# Patient Record
Sex: Male | Born: 1965
Health system: Southern US, Community
[De-identification: ages and names within clinical notes are randomized; demographics above are authoritative.]

## PROBLEM LIST (undated history)

## (undated) DIAGNOSIS — I10 Essential (primary) hypertension: Secondary | ICD-10-CM

## (undated) DIAGNOSIS — I6529 Occlusion and stenosis of unspecified carotid artery: Secondary | ICD-10-CM

## (undated) DIAGNOSIS — G459 Transient cerebral ischemic attack, unspecified: Secondary | ICD-10-CM

## (undated) DIAGNOSIS — J40 Bronchitis, not specified as acute or chronic: Secondary | ICD-10-CM

## (undated) DIAGNOSIS — D649 Anemia, unspecified: Secondary | ICD-10-CM

## (undated) DIAGNOSIS — I2510 Atherosclerotic heart disease of native coronary artery without angina pectoris: Secondary | ICD-10-CM

## (undated) DIAGNOSIS — M1990 Unspecified osteoarthritis, unspecified site: Secondary | ICD-10-CM

## (undated) DIAGNOSIS — I739 Peripheral vascular disease, unspecified: Secondary | ICD-10-CM

## (undated) DIAGNOSIS — K219 Gastro-esophageal reflux disease without esophagitis: Secondary | ICD-10-CM

## (undated) DIAGNOSIS — I219 Acute myocardial infarction, unspecified: Secondary | ICD-10-CM

## (undated) HISTORY — DX: Transient cerebral ischemic attack, unspecified: G45.9

## (undated) HISTORY — DX: Hyperlipidemia, unspecified: E78.5

## (undated) HISTORY — DX: Occlusion and stenosis of unspecified carotid artery: I65.29

## (undated) HISTORY — PX: BACK SURGERY: SHX140

---

## 2010-03-23 ENCOUNTER — Ambulatory Visit: Payer: Self-pay | Admitting: Cardiology

## 2011-06-14 ENCOUNTER — Emergency Department (HOSPITAL_COMMUNITY)
Admission: EM | Admit: 2011-06-14 | Discharge: 2011-06-14 | Disposition: A | Discharge status: Home / Self Care | Payer: Worker's Compensation | Attending: Emergency Medicine | Admitting: Emergency Medicine

## 2011-06-14 ENCOUNTER — Encounter: Payer: Self-pay | Admitting: Emergency Medicine

## 2011-06-14 ENCOUNTER — Emergency Department (HOSPITAL_COMMUNITY): Payer: Worker's Compensation

## 2011-06-14 DIAGNOSIS — R51 Headache: Secondary | ICD-10-CM | POA: Insufficient documentation

## 2011-06-14 DIAGNOSIS — Z79899 Other long term (current) drug therapy: Secondary | ICD-10-CM | POA: Insufficient documentation

## 2011-06-14 DIAGNOSIS — I10 Essential (primary) hypertension: Secondary | ICD-10-CM | POA: Insufficient documentation

## 2011-06-14 HISTORY — DX: Essential (primary) hypertension: I10

## 2011-06-14 MED ORDER — OXYCODONE-ACETAMINOPHEN 5-325 MG PO TABS: 2.0000 | ORAL_TABLET | ORAL | Status: DC | PRN

## 2011-06-14 MED ORDER — OXYCODONE-ACETAMINOPHEN 5-325 MG PO TABS
1.0000 | ORAL_TABLET | Freq: Once | ORAL | Status: AC
  Administered 2011-06-14: 1 via ORAL
  Filled 2011-06-14: qty 1
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
## 2011-06-16 ENCOUNTER — Encounter (HOSPITAL_COMMUNITY): Payer: Self-pay | Admitting: *Deleted

## 2011-06-16 ENCOUNTER — Emergency Department (HOSPITAL_COMMUNITY)
Admission: EM | Admit: 2011-06-16 | Discharge: 2011-06-16 | Disposition: A | Discharge status: Home / Self Care | Payer: Worker's Compensation | Attending: Emergency Medicine | Admitting: Emergency Medicine

## 2011-06-16 DIAGNOSIS — S139XXA Sprain of joints and ligaments of unspecified parts of neck, initial encounter: Secondary | ICD-10-CM | POA: Insufficient documentation

## 2011-06-16 DIAGNOSIS — I10 Essential (primary) hypertension: Secondary | ICD-10-CM | POA: Insufficient documentation

## 2011-06-16 DIAGNOSIS — F17200 Nicotine dependence, unspecified, uncomplicated: Secondary | ICD-10-CM | POA: Insufficient documentation

## 2011-06-16 DIAGNOSIS — G44209 Tension-type headache, unspecified, not intractable: Secondary | ICD-10-CM

## 2011-06-16 DIAGNOSIS — IMO0002 Reserved for concepts with insufficient information to code with codable children: Secondary | ICD-10-CM

## 2011-06-16 MED ORDER — DEXAMETHASONE SODIUM PHOSPHATE 10 MG/ML IJ SOLN
10.0000 mg | Freq: Once | INTRAMUSCULAR | Status: AC
  Administered 2011-06-16: 10 mg via INTRAMUSCULAR
  Filled 2011-06-16: qty 1

## 2011-06-16 MED ORDER — PREDNISONE 20 MG PO TABS: 60.0000 mg | ORAL_TABLET | Freq: Every day | ORAL | Status: AC

## 2011-06-16 MED ORDER — DIAZEPAM 5 MG PO TABS: 5.0000 mg | ORAL_TABLET | Freq: Three times a day (TID) | ORAL | Status: AC | PRN

## 2011-06-16 MED ORDER — DIAZEPAM 5 MG PO TABS
5.0000 mg | ORAL_TABLET | Freq: Once | ORAL | Status: AC
  Administered 2011-06-16: 5 mg via ORAL
  Filled 2011-06-16: qty 1

## 2011-06-16 MED ORDER — DEXAMETHASONE SODIUM PHOSPHATE 10 MG/ML IJ SOLN: 10.0000 mg | Freq: Once | INTRAMUSCULAR | Status: DC
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
## 2011-06-28 NOTE — ED Provider Notes (Signed)
Medical screening examination/treatment/procedure(s) were performed by non-physician practitioner and as supervising physician I was immediately available for consultation/collaboration.  Nicholes Stairs, MD 06/28/11 [phone]

## 2013-01-24 ENCOUNTER — Other Ambulatory Visit: Payer: Self-pay | Admitting: Neurosurgery

## 2013-01-29 ENCOUNTER — Ambulatory Visit (HOSPITAL_COMMUNITY)
Admission: RE | Admit: 2013-01-29 | Discharge: 2013-01-29 | Disposition: A | Discharge status: Home / Self Care | Payer: Worker's Compensation | Source: Ambulatory Visit | Attending: Anesthesiology | Admitting: Anesthesiology

## 2013-01-29 ENCOUNTER — Encounter (HOSPITAL_COMMUNITY): Payer: Self-pay

## 2013-01-29 ENCOUNTER — Encounter (HOSPITAL_COMMUNITY): Payer: Self-pay | Admitting: Pharmacy Technician

## 2013-01-29 ENCOUNTER — Encounter (HOSPITAL_COMMUNITY)
Admission: RE | Admit: 2013-01-29 | Discharge: 2013-01-29 | Disposition: A | Discharge status: Home / Self Care | Payer: Worker's Compensation | Source: Ambulatory Visit | Attending: Neurosurgery | Admitting: Neurosurgery

## 2013-01-29 HISTORY — DX: Gastro-esophageal reflux disease without esophagitis: K21.9

## 2013-01-29 LAB — CBC
HCT: 42.8 % (ref 39.0–52.0)
MCH: 30.3 pg (ref 26.0–34.0)
MCHC: 34.6 g/dL (ref 30.0–36.0)
MCV: 87.7 fL (ref 78.0–100.0)
RDW: 13.1 % (ref 11.5–15.5)

## 2013-01-29 LAB — BASIC METABOLIC PANEL
BUN: 8 mg/dL (ref 6–23)
CO2: 24 mEq/L (ref 19–32)
Chloride: 102 mEq/L (ref 96–112)
Creatinine, Ser: 1.02 mg/dL (ref 0.50–1.35)
GFR calc Af Amer: >90 mL/min (ref >90)
Glucose, Bld: 93 mg/dL (ref 70–99)

## 2013-01-29 LAB — SURGICAL PCR SCREEN: MRSA, PCR: NEGATIVE

## 2013-01-29 NOTE — Pre-Procedure Instructions (Signed)
Dale Fowler  01/29/2013   Your procedure is scheduled on:  May 22  Report to [facility] at 1:00 PM.  Call this number if you have problems the morning of surgery: [phone]   Remember:   Do not eat food or drink liquids after midnight.   Take these medicines the morning of surgery with A SIP OF WATER: None   Do not wear jewelry, make-up or nail polish.  Do not wear lotions, powders, or perfumes. You may wear deodorant.  Do not shave 48 hours prior to surgery. Men may shave face and neck.  Do not bring valuables to the hospital.  Contacts, dentures or bridgework may not be worn into surgery.  Leave suitcase in the car. After surgery it may be brought to your room.  For patients admitted to the hospital, checkout time is 11:00 AM the day of discharge.   Special Instructions: Shower using CHG 2 nights before surgery and the night before surgery.  If you shower the day of surgery use CHG.  Use special wash - you have one bottle of CHG for all showers.  You should use approximately 1/3 of the bottle for each shower.   Please read over the following fact sheets that you were given: Pain Booklet, Coughing and Deep Breathing and Surgical Site Infection Prevention

## 2013-01-29 NOTE — Progress Notes (Signed)
Spoke with Erie Noe and requested that MD sign orders

## 2013-01-30 ENCOUNTER — Encounter (HOSPITAL_COMMUNITY): Payer: Self-pay | Admitting: Certified Registered"

## 2013-01-30 ENCOUNTER — Ambulatory Visit (HOSPITAL_COMMUNITY): Payer: Worker's Compensation | Admitting: Certified Registered"

## 2013-01-30 ENCOUNTER — Encounter (HOSPITAL_COMMUNITY)
Admission: RE | Disposition: A | Discharge status: Home / Self Care | Payer: Self-pay | Source: Ambulatory Visit | Attending: Neurosurgery

## 2013-01-30 ENCOUNTER — Ambulatory Visit (HOSPITAL_COMMUNITY): Payer: Worker's Compensation

## 2013-01-30 ENCOUNTER — Ambulatory Visit (HOSPITAL_COMMUNITY)
Admission: RE | Admit: 2013-01-30 | Discharge: 2013-01-31 | Disposition: A | Discharge status: Home / Self Care | Payer: Worker's Compensation | Source: Ambulatory Visit | Attending: Neurosurgery | Admitting: Neurosurgery

## 2013-01-30 DIAGNOSIS — I10 Essential (primary) hypertension: Secondary | ICD-10-CM | POA: Insufficient documentation

## 2013-01-30 DIAGNOSIS — M4716 Other spondylosis with myelopathy, lumbar region: Secondary | ICD-10-CM

## 2013-01-30 DIAGNOSIS — M47817 Spondylosis without myelopathy or radiculopathy, lumbosacral region: Secondary | ICD-10-CM | POA: Insufficient documentation

## 2013-01-30 HISTORY — PX: LUMBAR LAMINECTOMY/DECOMPRESSION MICRODISCECTOMY: SHX5026

## 2013-01-30 IMAGING — CR DG LUMBAR SPINE 2-3V
1 series · 1 of 1 positions shown · non-contrast
Comparison: Lumbar myelogram and CT myelogram [DATE] which
demonstrated five non-rib bearing lumbar vertebrae.

CLINICAL DATA: L4-5 microdiskectomy.

OPERATIVE LUMBAR SPINE - 2-3 VIEW

[view not recorded]
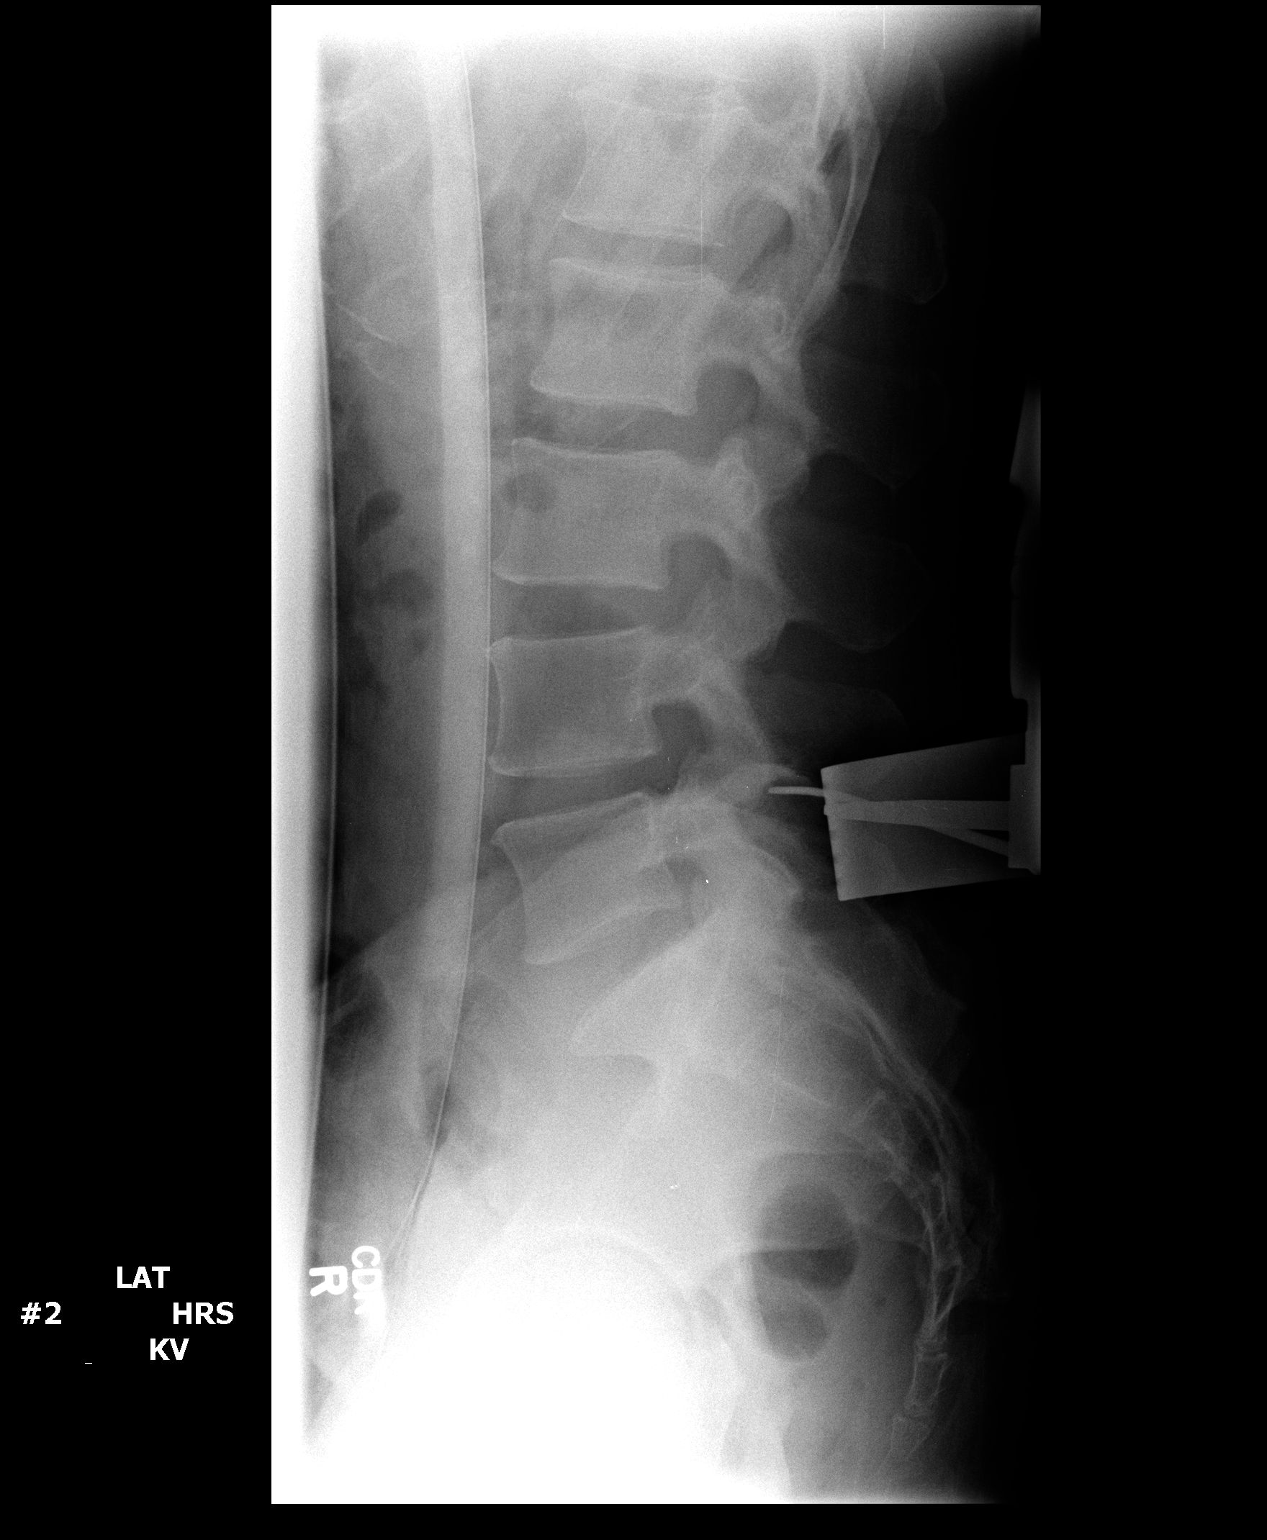

[1 of 1 positions shown; findings below may reference images not displayed]

FINDINGS: Images were submitted for interpretation postoperatively.
Image labeled #1 obtained at [QT] hours demonstrates the localizer
needle adjacent to the L5 spinous process.  Image labeled #2
obtained at [QT] hours demonstrate tissue spreaders with the
localizer device directly posterior to the L4-5 disc.
IMPRESSION: L4-5 localized intraoperatively.

## 2013-01-30 SURGERY — LUMBAR LAMINECTOMY/DECOMPRESSION MICRODISCECTOMY 1 LEVEL
Anesthesia: General | Site: Back | Laterality: Left | Wound class: Clean

## 2013-01-30 MED ORDER — PHENOL 1.4 % MT LIQD
1.0000 | OROMUCOSAL | Status: DC | PRN
  Administered 2013-01-30: 1 via OROMUCOSAL
  Filled 2013-01-30: qty 177

## 2013-01-30 MED ORDER — ACETAMINOPHEN 650 MG RE SUPP: 650.0000 mg | RECTAL | Status: DC | PRN

## 2013-01-30 MED ORDER — NEOSTIGMINE METHYLSULFATE 1 MG/ML IJ SOLN
INTRAMUSCULAR | Status: DC | PRN
  Administered 2013-01-30: 3 mg via INTRAVENOUS

## 2013-01-30 MED ORDER — FENTANYL CITRATE 0.05 MG/ML IJ SOLN
INTRAMUSCULAR | Status: DC | PRN
  Administered 2013-01-30: 50 ug via INTRAVENOUS
  Administered 2013-01-30 (×2): 100 ug via INTRAVENOUS
  Administered 2013-01-30: 250 ug via INTRAVENOUS

## 2013-01-30 MED ORDER — OXYCODONE HCL 5 MG PO TABS: 5.0000 mg | ORAL_TABLET | Freq: Once | ORAL | Status: DC | PRN

## 2013-01-30 MED ORDER — ONDANSETRON HCL 4 MG/2ML IJ SOLN: 4.0000 mg | INTRAMUSCULAR | Status: DC | PRN

## 2013-01-30 MED ORDER — DEXAMETHASONE 4 MG PO TABS
4.0000 mg | ORAL_TABLET | Freq: Four times a day (QID) | ORAL | Status: AC
  Administered 2013-01-30 – 2013-01-31 (×2): 4 mg via ORAL
  Filled 2013-01-30 (×2): qty 1

## 2013-01-30 MED ORDER — DEXAMETHASONE SODIUM PHOSPHATE 10 MG/ML IJ SOLN: 10.0000 mg | INTRAMUSCULAR | Status: DC

## 2013-01-30 MED ORDER — MIDAZOLAM HCL 5 MG/5ML IJ SOLN
INTRAMUSCULAR | Status: DC | PRN
  Administered 2013-01-30: 2 mg via INTRAVENOUS

## 2013-01-30 MED ORDER — HYDROMORPHONE HCL PF 1 MG/ML IJ SOLN
1.0000 mg | INTRAMUSCULAR | Status: DC | PRN
  Administered 2013-01-30: 1.5 mg via INTRAMUSCULAR
  Administered 2013-01-31: 1 mg via INTRAMUSCULAR
  Filled 2013-01-30: qty 2

## 2013-01-30 MED ORDER — HYDROCODONE-ACETAMINOPHEN 5-325 MG PO TABS
1.0000 | ORAL_TABLET | ORAL | Status: DC | PRN
  Administered 2013-01-31: 2 via ORAL
  Filled 2013-01-30: qty 2

## 2013-01-30 MED ORDER — DEXAMETHASONE SODIUM PHOSPHATE 10 MG/ML IJ SOLN
INTRAMUSCULAR | Status: AC
  Filled 2013-01-30: qty 1

## 2013-01-30 MED ORDER — ONDANSETRON HCL 4 MG/2ML IJ SOLN
4.0000 mg | Freq: Four times a day (QID) | INTRAMUSCULAR | Status: AC | PRN
  Administered 2013-01-30: 4 mg via INTRAVENOUS

## 2013-01-30 MED ORDER — BUPIVACAINE HCL (PF) 0.5 % IJ SOLN
INTRAMUSCULAR | Status: DC | PRN
  Administered 2013-01-30: 20 mL

## 2013-01-30 MED ORDER — KETOROLAC TROMETHAMINE 30 MG/ML IJ SOLN
INTRAMUSCULAR | Status: DC | PRN
  Administered 2013-01-30: 30 mg via INTRAVENOUS

## 2013-01-30 MED ORDER — THROMBIN 5000 UNITS EX SOLR
CUTANEOUS | Status: DC | PRN
  Administered 2013-01-30 (×2): 5000 [IU] via TOPICAL

## 2013-01-30 MED ORDER — KCL IN DEXTROSE-NACL 20-5-0.45 MEQ/L-%-% IV SOLN
80.0000 mL/h | INTRAVENOUS | Status: DC
  Administered 2013-01-30: 80 mL/h via INTRAVENOUS
  Filled 2013-01-30 (×3): qty 1000

## 2013-01-30 MED ORDER — GLYCOPYRROLATE 0.2 MG/ML IJ SOLN
INTRAMUSCULAR | Status: DC | PRN
  Administered 2013-01-30: 0.4 mg via INTRAVENOUS

## 2013-01-30 MED ORDER — LIDOCAINE HCL (CARDIAC) 20 MG/ML IV SOLN
INTRAVENOUS | Status: DC | PRN
  Administered 2013-01-30: 80 mg via INTRAVENOUS

## 2013-01-30 MED ORDER — MENTHOL 3 MG MT LOZG
1.0000 | LOZENGE | OROMUCOSAL | Status: DC | PRN
  Filled 2013-01-30: qty 9

## 2013-01-30 MED ORDER — CEFAZOLIN SODIUM-DEXTROSE 2-3 GM-% IV SOLR
2.0000 g | Freq: Three times a day (TID) | INTRAVENOUS | Status: AC
  Administered 2013-01-30 – 2013-01-31 (×2): 2 g via INTRAVENOUS
  Filled 2013-01-30 (×2): qty 50

## 2013-01-30 MED ORDER — HEMOSTATIC AGENTS (NO CHARGE) OPTIME
TOPICAL | Status: DC | PRN
  Administered 2013-01-30: 1 via TOPICAL

## 2013-01-30 MED ORDER — HYDROMORPHONE HCL PF 1 MG/ML IJ SOLN
INTRAMUSCULAR | Status: AC
  Filled 2013-01-30: qty 1

## 2013-01-30 MED ORDER — SODIUM CHLORIDE 0.9 % IJ SOLN: 3.0000 mL | INTRAMUSCULAR | Status: DC | PRN

## 2013-01-30 MED ORDER — DEXAMETHASONE SODIUM PHOSPHATE 4 MG/ML IJ SOLN: 4.0000 mg | Freq: Four times a day (QID) | INTRAMUSCULAR | Status: AC

## 2013-01-30 MED ORDER — KETOROLAC TROMETHAMINE 30 MG/ML IJ SOLN
30.0000 mg | Freq: Four times a day (QID) | INTRAMUSCULAR | Status: DC
  Administered 2013-01-30 – 2013-01-31 (×3): 30 mg via INTRAVENOUS
  Filled 2013-01-30 (×7): qty 1

## 2013-01-30 MED ORDER — PROPOFOL 10 MG/ML IV BOLUS
INTRAVENOUS | Status: DC | PRN
  Administered 2013-01-30: 100 mg via INTRAVENOUS

## 2013-01-30 MED ORDER — ZOLPIDEM TARTRATE 5 MG PO TABS: 5.0000 mg | ORAL_TABLET | Freq: Every evening | ORAL | Status: DC | PRN

## 2013-01-30 MED ORDER — LIDOCAINE HCL 4 % MT SOLN
OROMUCOSAL | Status: DC | PRN
  Administered 2013-01-30: 4 mL via TOPICAL

## 2013-01-30 MED ORDER — ROCURONIUM BROMIDE 100 MG/10ML IV SOLN
INTRAVENOUS | Status: DC | PRN
  Administered 2013-01-30: 50 mg via INTRAVENOUS

## 2013-01-30 MED ORDER — SODIUM CHLORIDE 0.9 % IR SOLN
Status: DC | PRN
  Administered 2013-01-30: 14:00:00

## 2013-01-30 MED ORDER — 0.9 % SODIUM CHLORIDE (POUR BTL) OPTIME
TOPICAL | Status: DC | PRN
  Administered 2013-01-30: 1000 mL

## 2013-01-30 MED ORDER — SODIUM CHLORIDE 0.9 % IV SOLN: 250.0000 mL | INTRAVENOUS | Status: DC

## 2013-01-30 MED ORDER — CYCLOBENZAPRINE HCL 10 MG PO TABS
10.0000 mg | ORAL_TABLET | Freq: Three times a day (TID) | ORAL | Status: DC | PRN
  Administered 2013-01-31: 10 mg via ORAL
  Filled 2013-01-30: qty 1

## 2013-01-30 MED ORDER — OXYCODONE HCL 5 MG PO TABS
ORAL_TABLET | ORAL | Status: AC
  Administered 2013-01-30: 5 mg
  Filled 2013-01-30: qty 1

## 2013-01-30 MED ORDER — HYDROMORPHONE HCL PF 1 MG/ML IJ SOLN
0.2500 mg | INTRAMUSCULAR | Status: DC | PRN
  Administered 2013-01-30 (×2): 0.5 mg via INTRAVENOUS

## 2013-01-30 MED ORDER — BACITRACIN 50000 UNITS IM SOLR
INTRAMUSCULAR | Status: AC
  Filled 2013-01-30: qty 1

## 2013-01-30 MED ORDER — LACTATED RINGERS IV SOLN
INTRAVENOUS | Status: DC | PRN
  Administered 2013-01-30 (×2): via INTRAVENOUS

## 2013-01-30 MED ORDER — ACETAMINOPHEN 325 MG PO TABS: 650.0000 mg | ORAL_TABLET | ORAL | Status: DC | PRN

## 2013-01-30 MED ORDER — CEFAZOLIN SODIUM-DEXTROSE 2-3 GM-% IV SOLR
2.0000 g | INTRAVENOUS | Status: AC
  Administered 2013-01-30: 2 g via INTRAVENOUS

## 2013-01-30 MED ORDER — KCL IN DEXTROSE-NACL 20-5-0.45 MEQ/L-%-% IV SOLN
INTRAVENOUS | Status: AC
  Filled 2013-01-30: qty 1000

## 2013-01-30 MED ORDER — SODIUM CHLORIDE 0.9 % IV SOLN
INTRAVENOUS | Status: AC
  Filled 2013-01-30: qty 500

## 2013-01-30 MED ORDER — LABETALOL HCL 5 MG/ML IV SOLN
INTRAVENOUS | Status: DC | PRN
  Administered 2013-01-30 (×2): 10 mg via INTRAVENOUS

## 2013-01-30 MED ORDER — SODIUM CHLORIDE 0.9 % IJ SOLN
3.0000 mL | Freq: Two times a day (BID) | INTRAMUSCULAR | Status: DC
  Administered 2013-01-30: 3 mL via INTRAVENOUS

## 2013-01-30 MED ORDER — OXYCODONE HCL 5 MG/5ML PO SOLN: 5.0000 mg | Freq: Once | ORAL | Status: DC | PRN

## 2013-01-30 MED ORDER — CEFAZOLIN SODIUM-DEXTROSE 2-3 GM-% IV SOLR
INTRAVENOUS | Status: AC
  Filled 2013-01-30: qty 50

## 2013-01-30 SURGICAL SUPPLY — 48 items
BAG DECANTER FOR FLEXI CONT (MISCELLANEOUS) ×2 IMPLANT
BENZOIN TINCTURE PRP APPL 2/3 (GAUZE/BANDAGES/DRESSINGS) ×4 IMPLANT
BLADE SURG ROTATE 9660 (MISCELLANEOUS) ×2 IMPLANT
BRUSH SCRUB EZ PLAIN DRY (MISCELLANEOUS) ×2 IMPLANT
BUR CUTTER 7.0 ROUND (BURR) ×2 IMPLANT
BUR MATCHSTICK NEURO 3.0 LAGG (BURR) ×2 IMPLANT
CANISTER SUCTION 2500CC (MISCELLANEOUS) ×2 IMPLANT
CLOTH BEACON ORANGE TIMEOUT ST (SAFETY) ×2 IMPLANT
CONT SPEC 4OZ CLIKSEAL STRL BL (MISCELLANEOUS) ×2 IMPLANT
DERMABOND ADHESIVE PROPEN (GAUZE/BANDAGES/DRESSINGS) ×1
DERMABOND ADVANCED (GAUZE/BANDAGES/DRESSINGS) ×1
DERMABOND ADVANCED .7 DNX12 (GAUZE/BANDAGES/DRESSINGS) ×1 IMPLANT
DERMABOND ADVANCED .7 DNX6 (GAUZE/BANDAGES/DRESSINGS) ×1 IMPLANT
DRAPE LAPAROTOMY 100X72X124 (DRAPES) ×2 IMPLANT
DRAPE MICROSCOPE ZEISS OPMI (DRAPES) ×2 IMPLANT
DRAPE SURG 17X23 STRL (DRAPES) ×4 IMPLANT
DRESSING TELFA 8X3 (GAUZE/BANDAGES/DRESSINGS) ×2 IMPLANT
DURAPREP 26ML APPLICATOR (WOUND CARE) ×2 IMPLANT
ELECT REM PT RETURN 9FT ADLT (ELECTROSURGICAL) ×2
ELECTRODE REM PT RTRN 9FT ADLT (ELECTROSURGICAL) ×1 IMPLANT
GAUZE SPONGE 4X4 16PLY XRAY LF (GAUZE/BANDAGES/DRESSINGS) IMPLANT
GLOVE BIOGEL PI IND STRL 8.5 (GLOVE) ×1 IMPLANT
GLOVE BIOGEL PI INDICATOR 8.5 (GLOVE) ×1
GLOVE ECLIPSE 7.5 STRL STRAW (GLOVE) ×2 IMPLANT
GLOVE ECLIPSE 8.5 STRL (GLOVE) ×2 IMPLANT
GLOVE INDICATOR 7.0 STRL GRN (GLOVE) ×2 IMPLANT
GLOVE OPTIFIT SS 6.5 STRL BRWN (GLOVE) ×4 IMPLANT
GOWN BRE IMP SLV AUR LG STRL (GOWN DISPOSABLE) ×4 IMPLANT
GOWN BRE IMP SLV AUR XL STRL (GOWN DISPOSABLE) ×2 IMPLANT
GOWN STRL REIN 2XL LVL4 (GOWN DISPOSABLE) IMPLANT
KIT BASIN OR (CUSTOM PROCEDURE TRAY) ×2 IMPLANT
KIT ROOM TURNOVER OR (KITS) ×2 IMPLANT
NEEDLE HYPO 22GX1.5 SAFETY (NEEDLE) ×2 IMPLANT
NEEDLE SPNL 22GX3.5 QUINCKE BK (NEEDLE) ×4 IMPLANT
NS IRRIG 1000ML POUR BTL (IV SOLUTION) ×2 IMPLANT
PACK LAMINECTOMY NEURO (CUSTOM PROCEDURE TRAY) ×2 IMPLANT
PAD ARMBOARD 7.5X6 YLW CONV (MISCELLANEOUS) ×6 IMPLANT
PATTIES SURGICAL .75X.75 (GAUZE/BANDAGES/DRESSINGS) ×2 IMPLANT
RUBBERBAND STERILE (MISCELLANEOUS) ×4 IMPLANT
SPONGE GAUZE 4X4 12PLY (GAUZE/BANDAGES/DRESSINGS) ×2 IMPLANT
SPONGE SURGIFOAM ABS GEL SZ50 (HEMOSTASIS) ×2 IMPLANT
STRIP CLOSURE SKIN 1/2X4 (GAUZE/BANDAGES/DRESSINGS) ×2 IMPLANT
SUT VIC AB 2-0 OS6 18 (SUTURE) ×6 IMPLANT
SUT VIC AB 3-0 CP2 18 (SUTURE) ×2 IMPLANT
SYR 20ML ECCENTRIC (SYRINGE) ×2 IMPLANT
TOWEL OR 17X24 6PK STRL BLUE (TOWEL DISPOSABLE) ×2 IMPLANT
TOWEL OR 17X26 10 PK STRL BLUE (TOWEL DISPOSABLE) ×2 IMPLANT
WATER STERILE IRR 1000ML POUR (IV SOLUTION) ×2 IMPLANT

## 2013-01-30 NOTE — Transfer of Care (Signed)
Immediate Anesthesia Transfer of Care Note  Patient: Dale Fowler  Procedure(s) Performed: Procedure(s) with comments: LUMBAR LAMINECTOMY/DECOMPRESSION MICRODISCECTOMY 1 LEVEL (Left) - lumbar four-five  Patient Location: PACU  Anesthesia Type:General  Level of Consciousness: awake, alert  and patient cooperative  Airway & Oxygen Therapy: Patient Spontanous Breathing and Patient connected to nasal cannula oxygen  Post-op Assessment: Report given to PACU RN, Post -op Vital signs reviewed and stable and Patient moving all extremities  Post vital signs: Reviewed and stable  Complications: No apparent anesthesia complications

## 2013-01-30 NOTE — Progress Notes (Signed)
Notified DR. HODIERN OF PATIENT BP 158/116- 158/108, PATIENT HAS NOT RETURNED TO PCP TO HAVE LISNOPRIL FILLED IN 1 YEAR OR MORE.  DR. Ladene Artist STATED THEY CAN GIVE MEDS IN OR.

## 2013-01-30 NOTE — H&P (Signed)
Dale Fowler is an 47 y.o. male.   Chief Complaint: Left leg pain HPI: The patient is a 69 old gentleman who presents the office with left leg radiating discomfort. He been tried on conservative therapy without improvement. He underwent imaging studies which showed an abnormality at L4-5 on the left. After failing further conservative therapy and discussing the options the patient requested surgery now comes for lumbar microdiscectomy L4-5 on the left. I had a long discussion with him regarding the risks and benefits of surgical intervention. The risks discussed include but are not limited to bleeding infection weakness numbness paralysis spinal fluid leakage coma and death. We have discussed alternative methods of therapy offered risks and benefits of nonintervention. He's had the opportunity to ask numerous questions and appears to understand. With this information in hand he has requested we proceed with surgery.  Past Medical History  Diagnosis Date  . Hypertension     does not see a cardiologist  . GERD (gastroesophageal reflux disease)     No past surgical history on file.  Family History  Problem Relation Age of Onset  . Hypertension Mother   . Diabetes Mother    Social History:  reports that he has been smoking.  He does not have any smokeless tobacco history on file. He reports that he does not drink alcohol or use illicit drugs.  Allergies: No Known Allergies  Medications Prior to Admission  Medication Sig Dispense Refill  . Naproxen Sodium (ALEVE PO) Take 2 capsules by mouth every 8 (eight) hours as needed (pain).        Results for orders placed during the hospital encounter of 01/29/13 (from the past 48 hour(s))  SURGICAL PCR SCREEN     Status: None   Collection Time    01/29/13  3:49 PM      Result Value Range   MRSA, PCR NEGATIVE  NEGATIVE   Staphylococcus aureus NEGATIVE  NEGATIVE   Comment:            The Xpert SA Assay (FDA     approved for NASAL specimens      in patients over 65 years of age),     is one component of     a comprehensive surveillance     program.  Test performance has     been validated by [employer] for patients greater     than or equal to 51 year old.     It is not intended     to diagnose infection nor to     guide or monitor treatment.  BASIC METABOLIC PANEL     Status: Abnormal   Collection Time    01/29/13  3:49 PM      Result Value Range   Sodium 140  135 - 145 mEq/L   Potassium 3.8  3.5 - 5.1 mEq/L   Chloride 102  96 - 112 mEq/L   CO2 24  19 - 32 mEq/L   Glucose, Bld 93  70 - 99 mg/dL   BUN 8  6 - 23 mg/dL   Creatinine, Ser 4.09  0.50 - 1.35 mg/dL   Calcium 9.5  8.4 - 81.1 mg/dL   GFR calc non Af Amer 86 (*) >90 mL/min   GFR calc Af Amer >90  >90 mL/min   Comment:            The eGFR has been calculated     using the CKD EPI equation.  This calculation has not been     validated in all clinical     situations.     eGFR's persistently     <90 mL/min signify     possible Chronic Kidney Disease.  CBC     Status: None   Collection Time    01/29/13  3:49 PM      Result Value Range   WBC 7.0  4.0 - 10.5 K/uL   RBC 4.88  4.22 - 5.81 MIL/uL   Hemoglobin 14.8  13.0 - 17.0 g/dL   HCT 40.9  81.1 - 91.4 %   MCV 87.7  78.0 - 100.0 fL   MCH 30.3  26.0 - 34.0 pg   MCHC 34.6  30.0 - 36.0 g/dL   RDW 78.2  95.6 - 21.3 %   Platelets 277  150 - 400 K/uL   Dg Chest 2 View  01/29/2013   *RADIOLOGY REPORT*  Clinical Data: History of hypertension.  Preop for lumbar spine surgery.  CHEST - 2 VIEW  Comparison: 03/23/2010 from [facility].  Findings: Midline trachea.  Normal heart size and mediastinal contours. No pleural effusion or pneumothorax.  Clear lungs.  IMPRESSION: Normal chest.   Original Report Authenticated By: Jeronimo Greaves, M.D.    Review of systems not obtained due to patient factors.  Blood pressure 159/108, pulse 83, temperature 98.1 F (36.7 C), resp. rate 20, SpO2 100.00%.  The  patient is awake or and oriented. His no facial asymmetry. His gait is mildly antalgic. Reflexes are decreased but equal. He is some mild weakness of extensor pollicis longus on the left and sensation is mildly decreased on the big toe on the left. Assessment/Plan Impression is that of L5 nerve root compression on the left secondary to an abnormality at L4-5. The plan is for an L4-5 decompressive laminotomy and evaluation of the L4-5 disc.  Reinaldo Meeker, MD 01/30/2013, 1:44 PM

## 2013-01-30 NOTE — Anesthesia Procedure Notes (Signed)
Procedure Name: Intubation Date/Time: 01/30/2013 2:09 PM Performed by: Armandina Gemma Pre-anesthesia Checklist: Patient identified, Timeout performed, Emergency Drugs available, Suction available and Patient being monitored Patient Re-evaluated:Patient Re-evaluated prior to inductionOxygen Delivery Method: Circle system utilized Preoxygenation: Pre-oxygenation with 100% oxygen Intubation Type: IV induction Ventilation: Mask ventilation without difficulty Laryngoscope Size: Miller and 2 Tube size: 7.5 mm Number of attempts: 1 Airway Equipment and Method: Stylet Placement Confirmation: ETT inserted through vocal cords under direct vision,  breath sounds checked- equal and bilateral and positive ETCO2 Secured at: 22 cm Tube secured with: Tape Dental Injury: Teeth and Oropharynx as per pre-operative assessment  Comments: IV induction Hodierene- intubation AM CRNA- atraumatic teeth and mouth as preop

## 2013-01-30 NOTE — Op Note (Signed)
Preop diagnosis: Spondylosis L4-5 left possible herniated disc with L5 nerve root compression Postop diagnosis: Spondylosis L4-5 left with L5 nerve root compression Procedure: Left L4-5 intralaminar laminotomy for decompression of L5 nerve root Surgeon: Aurielle Slingerland Assistant: Elsner  After being placed the prone position the patient's back was prepped and draped in usual sterile fashion. Localizing x-ray was taken prior to incision to identify the appropriate level. Midline incision was made above the spinous processes of L4 and L5. Using Bovie cutting current the incision was carried on the spinous processes. Subperiosteal dissection was then carried out on the left side of the spinous processes and lamina and self-retaining tract was placed for exposure. X-ray showed approach the appropriate level. Using the high-speed drill the inferior one half of the L4 lamina the medial one third of the facet joint the superior one third of the L5 lamina were removed. Residual bone and ligamentum flavum removed in a piecemeal fashion. L5 nerve root was identified and decompressed as 1 out the lateral recess out its foramen. There is marked lateral recess stenosis which was relieved with her decompression. We then used the microscope to evaluate the L4-5 disc was found not to be herniated and not in need of removal. We therefore felt of the L5 nerve were well decompressed related to close. Large amounts of irrigation were carried out and any bleeding control proper coagulation Gelfoam. The was then closed in multiple layers of Vicryl on the muscle fascia subcutaneous and subcuticular tissues. Dermabond and Steri-Strips were placed on the skin. Shortness was then applied and the patient was extubated and taken to recovery room in stable condition.

## 2013-01-30 NOTE — Preoperative (Signed)
Beta Blockers   Reason not to administer Beta Blockers:Not Applicable 

## 2013-01-30 NOTE — Plan of Care (Signed)
Problem: Consults Goal: Diagnosis - Spinal Surgery Outcome: Completed/Met Date Met:  01/30/13 Microdiscectomy

## 2013-01-30 NOTE — Anesthesia Postprocedure Evaluation (Signed)
Anesthesia Post Note  Patient: Dale Fowler  Procedure(s) Performed: Procedure(s) (LRB): LUMBAR LAMINECTOMY/DECOMPRESSION MICRODISCECTOMY 1 LEVEL (Left)  Anesthesia type: General  Patient location: PACU  Post pain: Pain level controlled and Adequate analgesia  Post assessment: Post-op Vital signs reviewed, Patient's Cardiovascular Status Stable, Respiratory Function Stable, Patent Airway and Pain level controlled  Last Vitals:  Filed Vitals:   01/30/13 1622  BP: 140/97  Pulse: 70  Temp:   Resp: 12    Post vital signs: Reviewed and stable  Level of consciousness: awake, alert  and oriented  Complications: No apparent anesthesia complications

## 2013-01-30 NOTE — Anesthesia Preprocedure Evaluation (Addendum)
Anesthesia Evaluation  Patient identified by MRN, date of birth, ID band Patient awake    Reviewed: Allergy & Precautions, H&P , NPO status , Patient's Chart, lab work & pertinent test results  Airway Mallampati: II  Neck ROM: full    Dental  (+) Dental Advisory Given, Poor Dentition, Missing and Chipped   Pulmonary Current Smoker,          Cardiovascular hypertension,     Neuro/Psych    GI/Hepatic GERD-  ,  Endo/Other    Renal/GU      Musculoskeletal   Abdominal   Peds  Hematology   Anesthesia Other Findings   Reproductive/Obstetrics                          Anesthesia Physical Anesthesia Plan  ASA: II  Anesthesia Plan: General   Post-op Pain Management:    Induction: Intravenous  Airway Management Planned: Oral ETT  Additional Equipment:   Intra-op Plan:   Post-operative Plan: Extubation in OR  Informed Consent: I have reviewed the patients History and Physical, chart, labs and discussed the procedure including the risks, benefits and alternatives for the proposed anesthesia with the patient or authorized representative who has indicated his/her understanding and acceptance.     Plan Discussed with: CRNA, Anesthesiologist and Surgeon  Anesthesia Plan Comments:         Anesthesia Quick Evaluation

## 2013-01-31 ENCOUNTER — Encounter (HOSPITAL_COMMUNITY): Payer: Self-pay | Admitting: Neurosurgery

## 2013-01-31 MED ORDER — PNEUMOCOCCAL VAC POLYVALENT 25 MCG/0.5ML IJ INJ
0.5000 mL | INJECTION | INTRAMUSCULAR | Status: DC
  Filled 2013-01-31: qty 0.5

## 2013-01-31 MED ORDER — HYDROCODONE-ACETAMINOPHEN 5-325 MG PO TABS: 1.0000 | ORAL_TABLET | ORAL | Status: DC | PRN

## 2013-01-31 NOTE — Discharge Summary (Signed)
  Physician Discharge Summary  Patient ID: Dale Fowler MRN: [id number] DOB/AGE: 1966-01-26 47 y.o.  Admit date: 01/30/2013 Discharge date: 01/31/2013  Admission Diagnoses:  Discharge Diagnoses:  Active Problems:   * No active hospital problems. *   Discharged Condition: good  Hospital Course: Surgery one day for lumbar spondylosis. Did well. Up ambulating pod 1, pain free. Home with specific instructions.  Consults: None  Significant Diagnostic Studies: none  Treatments: surgery: Left L 45 decompression  Discharge Exam: Blood pressure 138/92, pulse 69, temperature 97.8 F (36.6 C), temperature source Oral, resp. rate 18, SpO2 96.00%. Incision/Wound:clean and dry  Disposition: 01-Home or Self Care     Medication List    ASK your doctor about these medications       ALEVE PO  Take 2 capsules by mouth every 8 (eight) hours as needed (pain).         At home rest most of the time. Get up 9 or 10 times each day and take a 15 or 20 minute walk. No riding in the car and to your first postoperative appointment. If you have neck surgery you may shower from the chest down starting on the third postoperative day. If you had back surgery he may start showering on the third postoperative day with saran wrap wrapped around your incisional area 3 times. After the shower remove the saran wrap. Take pain medicine as needed and other medications as instructed. Call my office for an appointment.  SignedReinaldo Meeker, MD 01/31/2013, 9:44 AM

## 2016-07-25 NOTE — Progress Notes (Deleted)
Psychiatric Initial Adult Assessment   Patient Identification: Genella MechVictor Tamargo MRN:  [id number] Date of Evaluation:  07/25/2016 Referral Source: *** Chief Complaint:   Visit Diagnosis: No diagnosis found.  History of Present Illness:   Genella MechVictor Cone is a 50 year old male with spondylitis  Associated Signs/Symptoms: Depression Symptoms:  {DEPRESSION SYMPTOMS:20000} (Hypo) Manic Symptoms:  {BHH MANIC SYMPTOMS:22872} Anxiety Symptoms:  {BHH ANXIETY SYMPTOMS:22873} Psychotic Symptoms:  {BHH PSYCHOTIC SYMPTOMS:22874} PTSD Symptoms: {BHH PTSD SYMPTOMS:22875}  Past Psychiatric History: ***  Previous Psychotropic Medications: {YES/NO:21197}  Substance Abuse History in the last 12 months:  {yes no:314532}  Consequences of Substance Abuse: {BHH CONSEQUENCES OF SUBSTANCE ABUSE:22880}  Past Medical History:  Past Medical History:  Diagnosis Date  . GERD (gastroesophageal reflux disease)   . Hypertension    does not see a cardiologist    Past Surgical History:  Procedure Laterality Date  . LUMBAR LAMINECTOMY/DECOMPRESSION MICRODISCECTOMY Left 01/30/2013   Procedure: LUMBAR LAMINECTOMY/DECOMPRESSION MICRODISCECTOMY 1 LEVEL;  Surgeon: Reinaldo Meekerandy O Kritzer, MD;  Location: [facility];  Service: Neurosurgery;  Laterality: Left;  lumbar four-five    Family Psychiatric History: ***  Family History:  Family History  Problem Relation Age of Onset  . Hypertension Mother   . Diabetes Mother     Social History:   Social History   Social History  . Marital status: Divorced    Spouse name: N/A  . Number of children: N/A  . Years of education: N/A   Social History Main Topics  . Smoking status: Current Every Day Smoker    Packs/day: 0.50    Years: 18.00  . Smokeless tobacco: Not on file  . Alcohol use No  . Drug use: No  . Sexual activity: Not on file   Other Topics Concern  . Not on file   Social History Narrative  . No narrative on file    Additional Social History:  ***  Allergies:  No Known Allergies  Metabolic Disorder Labs: No results found for: HGBA1C, MPG No results found for: PROLACTIN No results found for: CHOL, TRIG, HDL, CHOLHDL, VLDL, LDLCALC   Current Medications: Current Outpatient Prescriptions  Medication Sig Dispense Refill  . HYDROcodone-acetaminophen (NORCO/VICODIN) 5-325 MG per tablet Take 1-2 tablets by mouth every 4 (four) hours as needed. 50 tablet 1  . Naproxen Sodium (ALEVE PO) Take 2 capsules by mouth every 8 (eight) hours as needed (pain).     No current facility-administered medications for this visit.     Neurologic: Headache: {BHH YES OR NO:22294} Seizure: {BHH YES OR NO:22294} Paresthesias:{BHH YES OR SA:63016}O:22294}  Musculoskeletal: Strength & Muscle Tone: {desc; muscle tone:32375} Gait & Station: {PE GAIT ED WFUX:32355}ATL:22525} Patient leans: {Patient Leans:21022755}  Psychiatric Specialty Exam: ROS  There were no vitals taken for this visit.There is no height or weight on file to calculate BMI.  General Appearance: {Appearance:22683}  Eye Contact:  {BHH EYE CONTACT:22684}  Speech:  {Speech:22685}  Volume:  {Volume (PAA):22686}  Mood:  {BHH MOOD:22306}  Affect:  {Affect (PAA):22687}  Thought Process:  {Thought Process (PAA):22688}  Orientation:  {BHH ORIENTATION (PAA):22689}  Thought Content:  {Thought Content:22690}  Suicidal Thoughts:  {ST/HT (PAA):22692}  Homicidal Thoughts:  {ST/HT (PAA):22692}  Memory:  {BHH MEMORY:22881}  Judgement:  {Judgement (PAA):22694}  Insight:  {Insight (PAA):22695}  Psychomotor Activity:  {Psychomotor (PAA):22696}  Concentration:  {Concentration:21399}  Recall:  {BHH GOOD/FAIR/POOR:22877}  Fund of Knowledge:{BHH GOOD/FAIR/POOR:22877}  Language: {BHH GOOD/FAIR/POOR:22877}  Akathisia:  {BHH YES OR NO:22294}  Handed:  {Handed:22697}  AIMS (if indicated):  ***  Assets:  {Assets (PAA):22698}  ADL's:  {BHH ZOX'W:96045}ADL'S:22290}  Cognition: {chl bhh cognition:304700322}  Sleep:  ***     Treatment Plan Summary: {CHL AMB BH MD TX WUJW:[id number]}Plan:[phone]}   Neysa Hottereina Jemal Miskell, MD 11/14/20171:56 PM

## 2016-07-27 ENCOUNTER — Ambulatory Visit (HOSPITAL_COMMUNITY): Payer: Self-pay | Admitting: Psychiatry

## 2016-07-27 ENCOUNTER — Encounter (HOSPITAL_COMMUNITY): Payer: Self-pay

## 2016-09-11 DIAGNOSIS — Z9889 Other specified postprocedural states: Secondary | ICD-10-CM | POA: Insufficient documentation

## 2017-07-12 ENCOUNTER — Other Ambulatory Visit: Payer: Self-pay | Admitting: Neurosurgery

## 2017-07-12 DIAGNOSIS — M5126 Other intervertebral disc displacement, lumbar region: Secondary | ICD-10-CM

## 2017-07-23 ENCOUNTER — Ambulatory Visit
Admission: RE | Admit: 2017-07-23 | Discharge: 2017-07-23 | Disposition: A | Discharge status: Home / Self Care | Payer: Medicaid Other | Source: Ambulatory Visit | Attending: Neurosurgery | Admitting: Neurosurgery

## 2017-07-23 ENCOUNTER — Other Ambulatory Visit: Payer: Self-pay | Admitting: Neurosurgery

## 2017-07-23 DIAGNOSIS — M5126 Other intervertebral disc displacement, lumbar region: Secondary | ICD-10-CM

## 2017-07-23 MED ORDER — IOPAMIDOL (ISOVUE-M 200) INJECTION 41%
1.0000 mL | Freq: Once | INTRAMUSCULAR | Status: AC
  Administered 2017-07-23: 1 mL via EPIDURAL

## 2017-07-23 MED ORDER — METHYLPREDNISOLONE ACETATE 40 MG/ML INJ SUSP (RADIOLOG
120.0000 mg | Freq: Once | INTRAMUSCULAR | Status: AC
  Administered 2017-07-23: 120 mg via EPIDURAL

## 2017-07-23 NOTE — Discharge Instructions (Signed)

## 2017-08-09 DIAGNOSIS — M5126 Other intervertebral disc displacement, lumbar region: Secondary | ICD-10-CM | POA: Insufficient documentation

## 2017-08-09 DIAGNOSIS — M5416 Radiculopathy, lumbar region: Secondary | ICD-10-CM | POA: Insufficient documentation

## 2017-08-10 ENCOUNTER — Other Ambulatory Visit: Payer: Self-pay | Admitting: Nurse Practitioner

## 2017-08-10 DIAGNOSIS — M5126 Other intervertebral disc displacement, lumbar region: Secondary | ICD-10-CM

## 2017-08-20 ENCOUNTER — Other Ambulatory Visit: Payer: Medicaid Other

## 2017-08-30 ENCOUNTER — Ambulatory Visit
Admission: RE | Admit: 2017-08-30 | Discharge: 2017-08-30 | Disposition: A | Discharge status: Home / Self Care | Payer: Medicaid Other | Source: Ambulatory Visit | Attending: Nurse Practitioner | Admitting: Nurse Practitioner

## 2017-08-30 DIAGNOSIS — M5126 Other intervertebral disc displacement, lumbar region: Secondary | ICD-10-CM

## 2017-08-30 MED ORDER — IOPAMIDOL (ISOVUE-M 200) INJECTION 41%
1.0000 mL | Freq: Once | INTRAMUSCULAR | Status: AC
  Administered 2017-08-30: 1 mL via EPIDURAL

## 2017-08-30 MED ORDER — METHYLPREDNISOLONE ACETATE 40 MG/ML INJ SUSP (RADIOLOG
120.0000 mg | Freq: Once | INTRAMUSCULAR | Status: AC
  Administered 2017-08-30: 120 mg via EPIDURAL

## 2017-12-06 DIAGNOSIS — M4726 Other spondylosis with radiculopathy, lumbar region: Secondary | ICD-10-CM | POA: Diagnosis present

## 2017-12-06 DIAGNOSIS — M1990 Unspecified osteoarthritis, unspecified site: Secondary | ICD-10-CM | POA: Diagnosis present

## 2017-12-06 DIAGNOSIS — Z79899 Other long term (current) drug therapy: Secondary | ICD-10-CM | POA: Diagnosis not present

## 2017-12-06 DIAGNOSIS — I10 Essential (primary) hypertension: Secondary | ICD-10-CM | POA: Diagnosis present

## 2017-12-06 DIAGNOSIS — M9689 Other intraoperative and postprocedural complications and disorders of the musculoskeletal system: Secondary | ICD-10-CM | POA: Diagnosis present

## 2017-12-06 DIAGNOSIS — Z91013 Allergy to seafood: Secondary | ICD-10-CM | POA: Diagnosis not present

## 2017-12-28 DIAGNOSIS — I63412 Cerebral infarction due to embolism of left middle cerebral artery: Secondary | ICD-10-CM | POA: Insufficient documentation

## 2017-12-28 DIAGNOSIS — D649 Anemia, unspecified: Secondary | ICD-10-CM | POA: Insufficient documentation

## 2017-12-29 DIAGNOSIS — D649 Anemia, unspecified: Secondary | ICD-10-CM | POA: Diagnosis not present

## 2017-12-29 DIAGNOSIS — G8929 Other chronic pain: Secondary | ICD-10-CM | POA: Insufficient documentation

## 2017-12-29 DIAGNOSIS — Z720 Tobacco use: Secondary | ICD-10-CM | POA: Insufficient documentation

## 2017-12-31 MED ORDER — CALCIUM CARBONATE ANTACID 500 MG PO CHEW
1000.00 | CHEWABLE_TABLET | ORAL | Status: DC
Start: ? — End: 2017-12-31

## 2017-12-31 MED ORDER — ONDANSETRON HCL 4 MG/2ML IJ SOLN
4.00 | INTRAMUSCULAR | Status: DC
Start: ? — End: 2017-12-31

## 2017-12-31 MED ORDER — AMLODIPINE BESYLATE 10 MG PO TABS
10.00 | ORAL_TABLET | ORAL | Status: DC
Start: 2017-12-31 — End: 2017-12-31

## 2017-12-31 MED ORDER — ACETAMINOPHEN 325 MG PO TABS
650.00 | ORAL_TABLET | ORAL | Status: DC
Start: ? — End: 2017-12-31

## 2017-12-31 MED ORDER — FLUOXETINE HCL 20 MG PO CAPS
80.00 | ORAL_CAPSULE | ORAL | Status: DC
Start: 2017-12-31 — End: 2017-12-31

## 2017-12-31 MED ORDER — ATORVASTATIN CALCIUM 40 MG PO TABS
40.00 | ORAL_TABLET | ORAL | Status: DC
Start: 2017-12-30 — End: 2017-12-31

## 2017-12-31 MED ORDER — CLOPIDOGREL BISULFATE 75 MG PO TABS
75.00 | ORAL_TABLET | ORAL | Status: DC
Start: 2017-12-31 — End: 2017-12-31

## 2017-12-31 MED ORDER — ANTACID & ANTIGAS 200-200-20 MG/5ML PO SUSP
30.00 | ORAL | Status: DC
Start: ? — End: 2017-12-31

## 2017-12-31 MED ORDER — GENERIC EXTERNAL MEDICATION
Status: DC
Start: ? — End: 2017-12-31

## 2017-12-31 MED ORDER — LABETALOL HCL 5 MG/ML IV SOLN
10.00 | INTRAVENOUS | Status: DC
Start: ? — End: 2017-12-31

## 2017-12-31 MED ORDER — HEPARIN SODIUM (PORCINE) 5000 UNIT/ML IJ SOLN
INTRAMUSCULAR | Status: DC
Start: 2017-12-31 — End: 2017-12-31

## 2017-12-31 MED ORDER — ACETAMINOPHEN 650 MG RE SUPP
650.00 | RECTAL | Status: DC
Start: ? — End: 2017-12-31

## 2017-12-31 MED ORDER — DOCUSATE SODIUM 100 MG PO CAPS
100.00 | ORAL_CAPSULE | ORAL | Status: DC
Start: 2017-12-30 — End: 2017-12-31

## 2017-12-31 MED ORDER — HYDROCODONE-ACETAMINOPHEN 5-325 MG PO TABS
ORAL_TABLET | ORAL | Status: DC
Start: ? — End: 2017-12-31

## 2017-12-31 MED ORDER — CYCLOBENZAPRINE HCL 10 MG PO TABS
10.00 | ORAL_TABLET | ORAL | Status: DC
Start: ? — End: 2017-12-31

## 2017-12-31 MED ORDER — SODIUM CHLORIDE 0.9 % IV SOLN
INTRAVENOUS | Status: DC
Start: ? — End: 2017-12-31

## 2017-12-31 MED ORDER — ASPIRIN 81 MG PO CHEW
81.00 | CHEWABLE_TABLET | ORAL | Status: DC
Start: 2017-12-31 — End: 2017-12-31

## 2018-07-18 DIAGNOSIS — F320 Major depressive disorder, single episode, mild: Secondary | ICD-10-CM | POA: Diagnosis not present

## 2018-07-18 DIAGNOSIS — R5383 Other fatigue: Secondary | ICD-10-CM | POA: Diagnosis not present

## 2018-07-18 DIAGNOSIS — D649 Anemia, unspecified: Secondary | ICD-10-CM | POA: Diagnosis not present

## 2018-07-18 DIAGNOSIS — Z6828 Body mass index (BMI) 28.0-28.9, adult: Secondary | ICD-10-CM | POA: Diagnosis not present

## 2018-07-18 DIAGNOSIS — I10 Essential (primary) hypertension: Secondary | ICD-10-CM | POA: Diagnosis not present

## 2018-07-31 DIAGNOSIS — M5126 Other intervertebral disc displacement, lumbar region: Secondary | ICD-10-CM | POA: Diagnosis not present

## 2018-07-31 DIAGNOSIS — M5416 Radiculopathy, lumbar region: Secondary | ICD-10-CM | POA: Diagnosis not present

## 2018-08-12 DIAGNOSIS — Z87891 Personal history of nicotine dependence: Secondary | ICD-10-CM | POA: Diagnosis not present

## 2018-08-12 DIAGNOSIS — Z8673 Personal history of transient ischemic attack (TIA), and cerebral infarction without residual deficits: Secondary | ICD-10-CM | POA: Diagnosis not present

## 2018-08-12 DIAGNOSIS — I10 Essential (primary) hypertension: Secondary | ICD-10-CM | POA: Diagnosis not present

## 2018-08-12 DIAGNOSIS — Z79899 Other long term (current) drug therapy: Secondary | ICD-10-CM | POA: Diagnosis not present

## 2018-08-12 DIAGNOSIS — R0602 Shortness of breath: Secondary | ICD-10-CM | POA: Diagnosis not present

## 2018-08-12 DIAGNOSIS — Z7982 Long term (current) use of aspirin: Secondary | ICD-10-CM | POA: Diagnosis not present

## 2018-08-12 DIAGNOSIS — K219 Gastro-esophageal reflux disease without esophagitis: Secondary | ICD-10-CM | POA: Diagnosis not present

## 2018-08-12 DIAGNOSIS — E7800 Pure hypercholesterolemia, unspecified: Secondary | ICD-10-CM | POA: Diagnosis not present

## 2018-08-14 DIAGNOSIS — Z8673 Personal history of transient ischemic attack (TIA), and cerebral infarction without residual deficits: Secondary | ICD-10-CM | POA: Diagnosis not present

## 2018-09-06 DIAGNOSIS — R5383 Other fatigue: Secondary | ICD-10-CM | POA: Diagnosis not present

## 2018-09-06 DIAGNOSIS — F320 Major depressive disorder, single episode, mild: Secondary | ICD-10-CM | POA: Diagnosis not present

## 2018-09-06 DIAGNOSIS — I10 Essential (primary) hypertension: Secondary | ICD-10-CM | POA: Diagnosis not present

## 2018-09-13 ENCOUNTER — Telehealth: Payer: Self-pay

## 2018-09-13 NOTE — Telephone Encounter (Signed)
SENT REFERRAL TO SCHEDULING AND FILED NOTES 

## 2018-10-24 ENCOUNTER — Encounter: Payer: Self-pay | Admitting: Cardiology

## 2018-11-04 DIAGNOSIS — M5126 Other intervertebral disc displacement, lumbar region: Secondary | ICD-10-CM | POA: Diagnosis not present

## 2018-11-04 DIAGNOSIS — M4716 Other spondylosis with myelopathy, lumbar region: Secondary | ICD-10-CM | POA: Diagnosis not present

## 2018-11-11 ENCOUNTER — Ambulatory Visit: Payer: Medicare Other | Admitting: Cardiology

## 2018-11-12 ENCOUNTER — Encounter: Payer: Self-pay | Admitting: Cardiology

## 2018-11-12 ENCOUNTER — Ambulatory Visit: Payer: Medicaid Other | Admitting: Cardiology

## 2019-01-24 DIAGNOSIS — Z6828 Body mass index (BMI) 28.0-28.9, adult: Secondary | ICD-10-CM | POA: Diagnosis not present

## 2019-01-24 DIAGNOSIS — M25562 Pain in left knee: Secondary | ICD-10-CM | POA: Diagnosis not present

## 2019-12-09 DIAGNOSIS — I10 Essential (primary) hypertension: Secondary | ICD-10-CM | POA: Diagnosis not present

## 2019-12-09 DIAGNOSIS — M545 Low back pain: Secondary | ICD-10-CM | POA: Diagnosis not present

## 2019-12-09 DIAGNOSIS — Z6827 Body mass index (BMI) 27.0-27.9, adult: Secondary | ICD-10-CM | POA: Diagnosis not present

## 2019-12-09 DIAGNOSIS — R5383 Other fatigue: Secondary | ICD-10-CM | POA: Diagnosis not present

## 2019-12-09 DIAGNOSIS — F320 Major depressive disorder, single episode, mild: Secondary | ICD-10-CM | POA: Diagnosis not present

## 2019-12-09 DIAGNOSIS — M479 Spondylosis, unspecified: Secondary | ICD-10-CM | POA: Diagnosis not present

## 2020-10-12 DIAGNOSIS — M545 Low back pain, unspecified: Secondary | ICD-10-CM | POA: Diagnosis not present

## 2020-10-12 DIAGNOSIS — M5136 Other intervertebral disc degeneration, lumbar region: Secondary | ICD-10-CM | POA: Diagnosis not present

## 2020-10-12 DIAGNOSIS — M5432 Sciatica, left side: Secondary | ICD-10-CM | POA: Diagnosis not present

## 2020-12-15 DIAGNOSIS — Z6827 Body mass index (BMI) 27.0-27.9, adult: Secondary | ICD-10-CM | POA: Diagnosis not present

## 2020-12-15 DIAGNOSIS — I10 Essential (primary) hypertension: Secondary | ICD-10-CM | POA: Diagnosis not present

## 2020-12-15 DIAGNOSIS — D649 Anemia, unspecified: Secondary | ICD-10-CM | POA: Diagnosis not present

## 2020-12-15 DIAGNOSIS — E559 Vitamin D deficiency, unspecified: Secondary | ICD-10-CM | POA: Diagnosis not present

## 2020-12-15 DIAGNOSIS — M479 Spondylosis, unspecified: Secondary | ICD-10-CM | POA: Diagnosis not present

## 2020-12-15 DIAGNOSIS — F331 Major depressive disorder, recurrent, moderate: Secondary | ICD-10-CM | POA: Diagnosis not present

## 2020-12-15 DIAGNOSIS — E785 Hyperlipidemia, unspecified: Secondary | ICD-10-CM | POA: Diagnosis not present

## 2021-03-18 DIAGNOSIS — I10 Essential (primary) hypertension: Secondary | ICD-10-CM | POA: Diagnosis not present

## 2021-03-18 DIAGNOSIS — D649 Anemia, unspecified: Secondary | ICD-10-CM | POA: Diagnosis not present

## 2021-03-18 DIAGNOSIS — F331 Major depressive disorder, recurrent, moderate: Secondary | ICD-10-CM | POA: Diagnosis not present

## 2021-03-18 DIAGNOSIS — E785 Hyperlipidemia, unspecified: Secondary | ICD-10-CM | POA: Diagnosis not present

## 2021-03-18 DIAGNOSIS — M479 Spondylosis, unspecified: Secondary | ICD-10-CM | POA: Diagnosis not present

## 2021-03-18 DIAGNOSIS — Z6826 Body mass index (BMI) 26.0-26.9, adult: Secondary | ICD-10-CM | POA: Diagnosis not present

## 2021-03-18 DIAGNOSIS — E559 Vitamin D deficiency, unspecified: Secondary | ICD-10-CM | POA: Diagnosis not present

## 2021-04-19 DIAGNOSIS — M545 Low back pain, unspecified: Secondary | ICD-10-CM | POA: Diagnosis not present

## 2021-04-19 DIAGNOSIS — I10 Essential (primary) hypertension: Secondary | ICD-10-CM | POA: Diagnosis not present

## 2021-04-19 DIAGNOSIS — Z6826 Body mass index (BMI) 26.0-26.9, adult: Secondary | ICD-10-CM | POA: Diagnosis not present

## 2021-04-19 DIAGNOSIS — M5417 Radiculopathy, lumbosacral region: Secondary | ICD-10-CM | POA: Diagnosis not present

## 2021-06-03 DIAGNOSIS — Z981 Arthrodesis status: Secondary | ICD-10-CM | POA: Diagnosis not present

## 2021-06-03 DIAGNOSIS — M5126 Other intervertebral disc displacement, lumbar region: Secondary | ICD-10-CM | POA: Diagnosis not present

## 2021-06-03 DIAGNOSIS — M5117 Intervertebral disc disorders with radiculopathy, lumbosacral region: Secondary | ICD-10-CM | POA: Diagnosis not present

## 2021-06-03 DIAGNOSIS — M48061 Spinal stenosis, lumbar region without neurogenic claudication: Secondary | ICD-10-CM | POA: Diagnosis not present

## 2021-07-20 DIAGNOSIS — M62830 Muscle spasm of back: Secondary | ICD-10-CM | POA: Diagnosis not present

## 2021-07-20 DIAGNOSIS — Z1331 Encounter for screening for depression: Secondary | ICD-10-CM | POA: Diagnosis not present

## 2021-07-20 DIAGNOSIS — M545 Low back pain, unspecified: Secondary | ICD-10-CM | POA: Diagnosis not present

## 2021-07-20 DIAGNOSIS — F331 Major depressive disorder, recurrent, moderate: Secondary | ICD-10-CM | POA: Diagnosis not present

## 2021-07-20 DIAGNOSIS — Z1389 Encounter for screening for other disorder: Secondary | ICD-10-CM | POA: Diagnosis not present

## 2021-07-20 DIAGNOSIS — K047 Periapical abscess without sinus: Secondary | ICD-10-CM | POA: Diagnosis not present

## 2021-07-20 DIAGNOSIS — Z0000 Encounter for general adult medical examination without abnormal findings: Secondary | ICD-10-CM | POA: Diagnosis not present

## 2021-07-20 DIAGNOSIS — Z125 Encounter for screening for malignant neoplasm of prostate: Secondary | ICD-10-CM | POA: Diagnosis not present

## 2021-07-20 DIAGNOSIS — Z6827 Body mass index (BMI) 27.0-27.9, adult: Secondary | ICD-10-CM | POA: Diagnosis not present

## 2021-07-20 DIAGNOSIS — I10 Essential (primary) hypertension: Secondary | ICD-10-CM | POA: Diagnosis not present

## 2021-12-22 DIAGNOSIS — H401131 Primary open-angle glaucoma, bilateral, mild stage: Secondary | ICD-10-CM | POA: Diagnosis not present

## 2022-01-24 DIAGNOSIS — M545 Low back pain, unspecified: Secondary | ICD-10-CM | POA: Diagnosis not present

## 2022-01-24 DIAGNOSIS — Z6827 Body mass index (BMI) 27.0-27.9, adult: Secondary | ICD-10-CM | POA: Diagnosis not present

## 2022-01-24 DIAGNOSIS — I10 Essential (primary) hypertension: Secondary | ICD-10-CM | POA: Diagnosis not present

## 2022-01-24 DIAGNOSIS — F331 Major depressive disorder, recurrent, moderate: Secondary | ICD-10-CM | POA: Diagnosis not present

## 2022-08-01 DIAGNOSIS — F331 Major depressive disorder, recurrent, moderate: Secondary | ICD-10-CM | POA: Diagnosis not present

## 2022-08-01 DIAGNOSIS — Z0000 Encounter for general adult medical examination without abnormal findings: Secondary | ICD-10-CM | POA: Diagnosis not present

## 2022-08-01 DIAGNOSIS — M479 Spondylosis, unspecified: Secondary | ICD-10-CM | POA: Diagnosis not present

## 2022-08-01 DIAGNOSIS — I10 Essential (primary) hypertension: Secondary | ICD-10-CM | POA: Diagnosis not present

## 2022-09-20 DIAGNOSIS — S82001A Unspecified fracture of right patella, initial encounter for closed fracture: Secondary | ICD-10-CM | POA: Diagnosis not present

## 2022-09-21 DIAGNOSIS — S82001A Unspecified fracture of right patella, initial encounter for closed fracture: Secondary | ICD-10-CM | POA: Diagnosis not present

## 2022-09-21 DIAGNOSIS — X58XXXA Exposure to other specified factors, initial encounter: Secondary | ICD-10-CM | POA: Diagnosis not present

## 2022-09-21 DIAGNOSIS — M899 Disorder of bone, unspecified: Secondary | ICD-10-CM | POA: Diagnosis not present

## 2022-09-21 DIAGNOSIS — Q741 Congenital malformation of knee: Secondary | ICD-10-CM | POA: Diagnosis not present

## 2022-09-21 DIAGNOSIS — M25561 Pain in right knee: Secondary | ICD-10-CM | POA: Diagnosis not present

## 2022-10-05 ENCOUNTER — Encounter: Payer: Self-pay | Admitting: Urology

## 2022-10-05 ENCOUNTER — Ambulatory Visit (INDEPENDENT_AMBULATORY_CARE_PROVIDER_SITE_OTHER): Payer: Medicare Other | Admitting: Urology

## 2022-10-05 VITALS — BP 160/104 | HR 82

## 2022-10-05 DIAGNOSIS — R3915 Urgency of urination: Secondary | ICD-10-CM

## 2022-10-05 DIAGNOSIS — N138 Other obstructive and reflux uropathy: Secondary | ICD-10-CM

## 2022-10-05 DIAGNOSIS — N401 Enlarged prostate with lower urinary tract symptoms: Secondary | ICD-10-CM | POA: Diagnosis not present

## 2022-10-05 DIAGNOSIS — R3129 Other microscopic hematuria: Secondary | ICD-10-CM

## 2022-10-05 DIAGNOSIS — R972 Elevated prostate specific antigen [PSA]: Secondary | ICD-10-CM | POA: Diagnosis not present

## 2022-10-05 LAB — URINALYSIS, ROUTINE W REFLEX MICROSCOPIC
Bilirubin, UA: NEGATIVE
Glucose, UA: NEGATIVE
Leukocytes,UA: NEGATIVE
Nitrite, UA: NEGATIVE
Specific Gravity, UA: 1.02 (ref 1.005–1.030)
Urobilinogen, Ur: 0.2 mg/dL (ref 0.2–1.0)
pH, UA: 5.5 (ref 5.0–7.5)

## 2022-10-05 LAB — MICROSCOPIC EXAMINATION: Bacteria, UA: NONE SEEN

## 2022-10-05 NOTE — Progress Notes (Signed)
Subjective: 1. Elevated PSA   2. BPH with urinary obstruction   3. Urgency of urination      Consult requested by Dr. Quintin Alto.  Dale Fowler is a 57 yo male who is sent by Dr. Leandrew Koyanagi for a PSA of 3.5 in 11/23.  He has a level in the 1's about a year ago.  He has had no prior UTI's.  He has had no GU surgery.  He has no family history of prostate cancer.   He has HTN with TIA x 2 in 2019.  He has some ED with occasional partial erections. He is currently dealing with a broken right patella after a fall.   He has some urgency with nocturia x 1.  He has no UUI.Marland Kitchen  He has had no hematuria or dysuria.  His UA has 3-10 RBC's.  ROS:  Review of Systems  Constitutional:  Positive for chills and malaise/fatigue.  Eyes:  Positive for blurred vision.  Musculoskeletal:  Positive for back pain and joint pain.  Skin:  Positive for itching.  Psychiatric/Behavioral:  Positive for depression, memory loss and substance abuse (Marijuana.). The patient is nervous/anxious.     Allergies  Allergen Reactions   Shellfish Allergy     FACIAL EDEMA    Past Medical History:  Diagnosis Date   GERD (gastroesophageal reflux disease)    Hyperlipidemia    Hypertension    does not see a cardiologist   TIA (transient ischemic attack)    X2    Past Surgical History:  Procedure Laterality Date   LUMBAR LAMINECTOMY/DECOMPRESSION MICRODISCECTOMY Left 01/30/2013   Procedure: LUMBAR LAMINECTOMY/DECOMPRESSION MICRODISCECTOMY 1 LEVEL;  Surgeon: Reinaldo Meeker, MD;  Location: [facility];  Service: Neurosurgery;  Laterality: Left;  lumbar four-five    Social History   Socioeconomic History   Marital status: Divorced    Spouse name: Not on file   Number of children: Not on file   Years of education: Not on file   Highest education level: Not on file  Occupational History   Not on file  Tobacco Use   Smoking status: Every Day    Packs/day: 0.50    Years: 18.00    Total pack years: 9.00    Types:  Cigarettes   Smokeless tobacco: Not on file  Vaping Use   Vaping Use: Never used  Substance and Sexual Activity   Alcohol use: Yes   Drug use: Yes   Sexual activity: Not on file  Other Topics Concern   Not on file  Social History Narrative   Not on file   Social Determinants of Health   Financial Resource Strain: Not on file  Food Insecurity: Not on file  Transportation Needs: Not on file  Physical Activity: Not on file  Stress: Not on file  Social Connections: Not on file  Intimate Partner Violence: Not on file    Family History  Problem Relation Age of Onset   Hypertension Mother    Diabetes Mother     Anti-infectives: Anti-infectives (From admission, onward)    None       Current Outpatient Medications  Medication Sig Dispense Refill   amLODipine (NORVASC) 10 MG tablet Take 10 mg by mouth daily.     atorvastatin (LIPITOR) 40 MG tablet Take 40 mg by mouth at bedtime.     metoprolol tartrate (LOPRESSOR) 25 MG tablet Take 25 mg by mouth daily.     No current facility-administered medications for this visit.  Objective: Vital signs in last 24 hours: BP (!) 160/104   Pulse 82   Intake/Output from previous day: No intake/output data recorded. Intake/Output this shift: @IOTHISSHIFT @   Physical Exam Vitals reviewed.  Constitutional:      Appearance: Normal appearance.  HENT:     Head: Normocephalic and atraumatic.  Cardiovascular:     Rate and Rhythm: Normal rate and regular rhythm.     Heart sounds: Normal heart sounds.  Pulmonary:     Effort: Pulmonary effort is normal.     Breath sounds: Normal breath sounds.  Abdominal:     General: Abdomen is flat.     Palpations: Abdomen is soft.     Hernia: No hernia is present.  Genitourinary:    Comments: Nl uncirc phallus with adequate meatus. Scrotum, testes and epididymis normal. AP/NST without mass. Prostate 2.5+ benign. SV non-palpable.  Musculoskeletal:        General: Signs of injury  (right knee.  Walks with a limp in the brace) present.     Cervical back: Normal range of motion and neck supple.  Skin:    General: Skin is warm and dry.  Neurological:     General: No focal deficit present.     Mental Status: He is alert and oriented to person, place, and time.  Psychiatric:        Mood and Affect: Mood normal.        Behavior: Behavior normal.     Lab Results:  Results for orders placed or performed in visit on 10/05/22 (from the past 24 hour(s))  Urinalysis, Routine w reflex microscopic     Status: Abnormal   Collection Time: 10/05/22 10:50 AM  Result Value Ref Range   Specific Gravity, UA 1.020 1.005 - 1.030   pH, UA 5.5 5.0 - 7.5   Color, UA Amber (A) Yellow   Appearance Ur Clear Clear   Leukocytes,UA Negative Negative   Protein,UA 1+ (A) Negative/Trace   Glucose, UA Negative Negative   Ketones, UA Trace (A) Negative   RBC, UA 1+ (A) Negative   Bilirubin, UA Negative Negative   Urobilinogen, Ur 0.2 0.2 - 1.0 mg/dL   Nitrite, UA Negative Negative   Microscopic Examination See below:    Narrative   Performed at:  [facility] [address] [place], [place]  [id number] Lab Director: Mina Marble MT, Phone:  [phone]  Microscopic Examination     Status: Abnormal   Collection Time: 10/05/22 10:50 AM   Urine  Result Value Ref Range   WBC, UA 0-5 0 - 5 /hpf   RBC, Urine 3-10 (A) 0 - 2 /hpf   Epithelial Cells (non renal) 0-10 0 - 10 /hpf   Bacteria, UA None seen None seen/Few   Narrative   Performed at:  [facility] [address] [place], [place]  [id number] Lab Director: Mina Marble MT, Phone:  [phone]    BMET No results for input(s): "NA", "K", "CL", "CO2", "GLUCOSE", "BUN", "CREATININE", "CALCIUM" in the last 72 hours. PT/INR No results for input(s): "LABPROT", "INR" in the last 72 hours. ABG No results for input(s): "PHART", "HCO3" in the last 72 hours.  Invalid input(s): "PCO2", "PO2" I have  reviewed from labs Dr. Pleas Koch including the PSA.  UA has 3-10 RBC's.   .  Studies/Results: No results found.   Assessment/Plan: Elevated PSA with benign exam.  I will repeat a PSA today and if it remains elevated, I will get him  set up for a biopsy.  I have reviewed the risks of bleeding, infection and voiding difficulty.  If the PSA is back down, he will return in 31mo with another PSA.  BPH with BOO.  He has some urgency but is otherwise voiding well.   Microhematuria.  He got a UA on the way out and has 3-10 RBC's.  He is going to need a hematuria w/u with CT and possible cystoscopy.   No orders of the defined types were placed in this encounter.    Orders Placed This Encounter  Procedures   Microscopic Examination   Urinalysis, Routine w reflex microscopic   PSA, total and free    Standing Status:   Future    Standing Expiration Date:   04/05/2023   PSA, total and free     Return in about 3 months (around 01/04/2023) for with PSA.    CC: Dr. Judd Lien.      Irine Seal 10/06/2022

## 2022-10-06 ENCOUNTER — Other Ambulatory Visit: Payer: Self-pay

## 2022-10-06 LAB — PSA, TOTAL AND FREE
PSA, Free Pct: 36.7 %
PSA, Free: 0.33 ng/mL
Prostate Specific Ag, Serum: 0.9 ng/mL (ref 0.0–4.0)

## 2022-10-09 DIAGNOSIS — M85661 Other cyst of bone, right lower leg: Secondary | ICD-10-CM | POA: Diagnosis not present

## 2022-10-09 DIAGNOSIS — M25561 Pain in right knee: Secondary | ICD-10-CM | POA: Diagnosis not present

## 2022-10-10 ENCOUNTER — Telehealth: Payer: Self-pay

## 2022-10-10 NOTE — Telephone Encounter (Signed)
-----  Message from Irine Seal, MD sent at 10/10/2022 11:07 AM EST ----- His PSA is back down to 0.9 so he will not need a biopsy, but with the hematuria, he will need the CT and possible cystoscopy.  ----- Message ----- From: Sherrilyn Rist, CMA Sent: 10/10/2022   9:58 AM EST To: Irine Seal, MD  Please review

## 2022-10-10 NOTE — Telephone Encounter (Signed)
Patient aware that his PSA is back down to 0.9 and he will not need a biopsy but with the hematuria  he will need a CT and possible cystoscopy. Patient voiced understanding

## 2022-10-10 NOTE — Telephone Encounter (Signed)
-----  Message from Irine Seal, MD sent at 10/06/2022  9:18 AM EST ----- His UA has 3-10 RBC's.  I didn't see that until this AM.   He is going to need a CT hematuria study which I have ordered.  He will possibly need cystoscopy when we get him in for follow up.

## 2022-10-10 NOTE — Telephone Encounter (Signed)
Patient is aware that his UA has 3-10 RBC's.  I didn't see that until this AM.   He is going to need a CT hematuria study which I have ordered.  He will possibly need cystoscopy when we get him in for follow up. Patient voiced understanding

## 2022-11-28 DIAGNOSIS — M545 Low back pain, unspecified: Secondary | ICD-10-CM | POA: Diagnosis not present

## 2022-11-28 DIAGNOSIS — M62830 Muscle spasm of back: Secondary | ICD-10-CM | POA: Diagnosis not present

## 2022-11-28 DIAGNOSIS — I10 Essential (primary) hypertension: Secondary | ICD-10-CM | POA: Diagnosis not present

## 2022-11-28 DIAGNOSIS — N401 Enlarged prostate with lower urinary tract symptoms: Secondary | ICD-10-CM | POA: Diagnosis not present

## 2022-12-07 ENCOUNTER — Ambulatory Visit (HOSPITAL_COMMUNITY)
Admission: RE | Admit: 2022-12-07 | Discharge: 2022-12-07 | Disposition: A | Discharge status: Home / Self Care | Payer: Medicare Other | Source: Ambulatory Visit | Attending: Urology | Admitting: Urology

## 2022-12-07 DIAGNOSIS — K5730 Diverticulosis of large intestine without perforation or abscess without bleeding: Secondary | ICD-10-CM | POA: Diagnosis not present

## 2022-12-07 DIAGNOSIS — R3129 Other microscopic hematuria: Secondary | ICD-10-CM | POA: Diagnosis not present

## 2022-12-07 DIAGNOSIS — N281 Cyst of kidney, acquired: Secondary | ICD-10-CM | POA: Diagnosis not present

## 2022-12-07 LAB — POCT I-STAT CREATININE: Creatinine, Ser: 1.2 mg/dL (ref 0.61–1.24)

## 2022-12-07 MED ORDER — IOHEXOL 300 MG/ML  SOLN
125.0000 mL | Freq: Once | INTRAMUSCULAR | Status: AC | PRN
  Administered 2022-12-07: 125 mL via INTRAVENOUS

## 2022-12-13 DIAGNOSIS — S8001XA Contusion of right knee, initial encounter: Secondary | ICD-10-CM | POA: Diagnosis not present

## 2022-12-28 ENCOUNTER — Ambulatory Visit: Payer: Medicare Other | Admitting: Urology

## 2022-12-28 ENCOUNTER — Telehealth: Payer: Self-pay

## 2022-12-28 ENCOUNTER — Encounter: Payer: Self-pay | Admitting: Urology

## 2022-12-28 VITALS — BP 128/83 | HR 71 | Ht 68.0 in | Wt 180.0 lb

## 2022-12-28 DIAGNOSIS — N138 Other obstructive and reflux uropathy: Secondary | ICD-10-CM

## 2022-12-28 DIAGNOSIS — N5201 Erectile dysfunction due to arterial insufficiency: Secondary | ICD-10-CM

## 2022-12-28 DIAGNOSIS — I739 Peripheral vascular disease, unspecified: Secondary | ICD-10-CM

## 2022-12-28 DIAGNOSIS — I150 Renovascular hypertension: Secondary | ICD-10-CM

## 2022-12-28 DIAGNOSIS — R3915 Urgency of urination: Secondary | ICD-10-CM

## 2022-12-28 DIAGNOSIS — R972 Elevated prostate specific antigen [PSA]: Secondary | ICD-10-CM

## 2022-12-28 DIAGNOSIS — R3129 Other microscopic hematuria: Secondary | ICD-10-CM

## 2022-12-28 DIAGNOSIS — N401 Enlarged prostate with lower urinary tract symptoms: Secondary | ICD-10-CM

## 2022-12-28 LAB — URINALYSIS, ROUTINE W REFLEX MICROSCOPIC
Bilirubin, UA: NEGATIVE
Glucose, UA: NEGATIVE
Ketones, UA: NEGATIVE
Leukocytes,UA: NEGATIVE
Nitrite, UA: NEGATIVE
Specific Gravity, UA: 1.025 (ref 1.005–1.030)
Urobilinogen, Ur: 0.2 mg/dL (ref 0.2–1.0)
pH, UA: 5.5 (ref 5.0–7.5)

## 2022-12-28 LAB — MICROSCOPIC EXAMINATION: Bacteria, UA: NONE SEEN

## 2022-12-28 MED ORDER — TADALAFIL 5 MG PO TABS: 5.0000 mg | ORAL_TABLET | Freq: Every day | ORAL | 11 refills | Status: DC | PRN

## 2022-12-28 MED ORDER — CIPROFLOXACIN HCL 500 MG PO TABS
500.0000 mg | ORAL_TABLET | Freq: Once | ORAL | Status: AC
  Administered 2022-12-28: 500 mg via ORAL

## 2022-12-28 NOTE — Telephone Encounter (Signed)
Patient seen in office today and MD discussed results at time of visit.

## 2022-12-28 NOTE — Telephone Encounter (Signed)
-----   Message from Bjorn Pippin, MD sent at 12/11/2022 12:36 PM EDT ----- He has a benign cyst of the kidney and extensive arterial calcifications consistent with peripheral and coronary atherosclerosis.   I will discuss further at f/u.  ----- Message ----- From: Grier Rocher, CMA Sent: 12/11/2022   8:33 AM EDT To: Bjorn Pippin, MD  Please review, cysto apt 04/18

## 2022-12-28 NOTE — Progress Notes (Signed)
Subjective: 1. Elevated PSA   2. Hypertensive renovascular disease   3. Peripheral vascular disease   4. Microhematuria   5. BPH with urinary obstruction   6. Urgency of urination   7. Erectile dysfunction due to arterial insufficiency      Consult requested by Dr. Quintin Alto.  12/28/22: Dale Fowler returns today in f/u.  His repeat PSA was back down to 0.9 with a 37% f/t ratio.  He had a CT hematuria study that shows a simple right renal cyst and renovascular disease as well as ASCAD and ASPVD with iliac stenosis.  He has some leg pain with walking a certain distance.  His UA is ok today.  He has a new complaint of ED today.  He is able to get an erection but can't maintain it.   Hx: Dale Fowler is a 57 yo male who is sent by Dr. Leandrew Koyanagi for a PSA of 3.5 in 11/23.  He has a level in the 1's about a year ago.  He has had no prior UTI's.  He has had no GU surgery.  He has no family history of prostate cancer.   He has HTN with TIA x 2 in 2019.  He has some ED with occasional partial erections. He is currently dealing with a broken right patella after a fall.   He has some urgency with nocturia x 1.  He has no UUI.Marland Kitchen  He has had no hematuria or dysuria.  His UA has 3-10 RBC's.  ROS:  Review of Systems  Cardiovascular:  Positive for claudication and leg swelling.  Musculoskeletal:  Positive for back pain and joint pain.  Skin:  Positive for itching.  Psychiatric/Behavioral:  Positive for depression, memory loss and substance abuse (Marijuana.). The patient is nervous/anxious.     Allergies  Allergen Reactions   Shellfish Allergy     FACIAL EDEMA    Past Medical History:  Diagnosis Date   GERD (gastroesophageal reflux disease)    Hyperlipidemia    Hypertension    does not see a cardiologist   TIA (transient ischemic attack)    X2    Past Surgical History:  Procedure Laterality Date   LUMBAR LAMINECTOMY/DECOMPRESSION MICRODISCECTOMY Left 01/30/2013   Procedure: LUMBAR  LAMINECTOMY/DECOMPRESSION MICRODISCECTOMY 1 LEVEL;  Surgeon: Reinaldo Meeker, MD;  Location: [facility];  Service: Neurosurgery;  Laterality: Left;  lumbar four-five    Social History   Socioeconomic History   Marital status: Divorced    Spouse name: Not on file   Number of children: Not on file   Years of education: Not on file   Highest education level: Not on file  Occupational History   Not on file  Tobacco Use   Smoking status: Every Day    Packs/day: 0.50    Years: 18.00    Additional pack years: 0.00    Total pack years: 9.00    Types: Cigarettes   Smokeless tobacco: Not on file  Vaping Use   Vaping Use: Never used  Substance and Sexual Activity   Alcohol use: Yes   Drug use: Yes   Sexual activity: Not on file  Other Topics Concern   Not on file  Social History Narrative   Not on file   Social Determinants of Health   Financial Resource Strain: Not on file  Food Insecurity: Not on file  Transportation Needs: Not on file  Physical Activity: Not on file  Stress: Not on file  Social Connections: Not on file  Intimate Partner Violence: Not on file    Family History  Problem Relation Age of Onset   Hypertension Mother    Diabetes Mother     Anti-infectives: Anti-infectives (From admission, onward)    Start     Dose/Rate Route Frequency Ordered Stop   12/28/22 1215  CIPROFLOXACIN HCL 500 MG PO TABS        500 mg Oral  Once 12/28/22 1210 12/28/22 1249       Current Outpatient Medications  Medication Sig Dispense Refill   amLODipine (NORVASC) 10 MG tablet Take 10 mg by mouth daily.     atorvastatin (LIPITOR) 40 MG tablet Take 40 mg by mouth at bedtime.     metoprolol tartrate (LOPRESSOR) 25 MG tablet Take 25 mg by mouth daily.     tadalafil (CIALIS) 5 MG tablet Take 1 tablet (5 mg total) by mouth daily as needed for erectile dysfunction. 30 tablet 11   No current facility-administered medications for this visit.     Objective: Vital signs in  last 24 hours: BP 128/83   Pulse 71   Ht 5\' 8"  (1.727 m)   Wt 180 lb (81.6 kg)   BMI 27.37 kg/m   Intake/Output from previous day: No intake/output data recorded. Intake/Output this shift: @IOTHISSHIFT @   Physical Exam Vitals reviewed.  Constitutional:      Appearance: Normal appearance.  Neurological:     Mental Status: He is alert.     Lab Results:  Results for orders placed or performed in visit on 12/28/22 (from the past 24 hour(s))  Urinalysis, Routine w reflex microscopic     Status: Abnormal   Collection Time: 12/28/22 11:14 AM  Result Value Ref Range   Specific Gravity, UA 1.025 1.005 - 1.030   pH, UA 5.5 5.0 - 7.5   Color, UA Yellow Yellow   Appearance Ur Clear Clear   Leukocytes,UA Negative Negative   Protein,UA Trace Negative/Trace   Glucose, UA Negative Negative   Ketones, UA Negative Negative   RBC, UA 1+ (A) Negative   Bilirubin, UA Negative Negative   Urobilinogen, Ur 0.2 0.2 - 1.0 mg/dL   Nitrite, UA Negative Negative   Microscopic Examination See below:    Narrative   Performed at:  [address] - [facility] [address] [place], [place]  [id number] Lab Director: Chinita Pester MT, Phone:  [phone]  Microscopic Examination     Status: None   Collection Time: 12/28/22 11:14 AM   Urine  Result Value Ref Range   WBC, UA 0-5 0 - 5 /hpf   RBC, Urine 0-2 0 - 2 /hpf   Epithelial Cells (non renal) 0-10 0 - 10 /hpf   Bacteria, UA None seen None seen/Few   Narrative   Performed at:  [address] - [facility] [address] [place], [place]  [id number] Lab Director: Chinita Pester MT, Phone:  [phone]     BMET No results for input(s): "NA", "K", "CL", "CO2", "GLUCOSE", "BUN", "CREATININE", "CALCIUM" in the last 72 hours. PT/INR No results for input(s): "LABPROT", "INR" in the last 72 hours. ABG No results for input(s): "PHART", "HCO3" in the last 72 hours.  Invalid input(s): "PCO2", "PO2" I have reviewed from labs Dr. Leandrew Koyanagi  including the PSA.  UA has 3-10 RBC's.   .  Studies/Results: No results found. CT HEMATURIA WORKUP  Result Date: 12/08/2022 CLINICAL DATA:  Microscopic hematuria EXAM: CT ABDOMEN AND PELVIS WITHOUT AND WITH CONTRAST TECHNIQUE: Multidetector CT imaging of the abdomen and  pelvis was performed following the standard protocol before and following the bolus administration of intravenous contrast. RADIATION DOSE REDUCTION: This exam was performed according to the departmental dose-optimization program which includes automated exposure control, adjustment of the mA and/or kV according to patient size and/or use of iterative reconstruction technique. CONTRAST:  OMNIPAQUE IOHEXOL 300 MG/ML  SOLN COMPARISON:  None Available. FINDINGS: Lower chest: There is some linear opacity lung bases likely scar or atelectasis. No pleural effusion. Coronary artery calcifications are seen. Please correlate for other coronary risk factors. Hepatobiliary: No focal liver abnormality is seen. No gallstones, gallbladder wall thickening, or biliary dilatation. Patent portal vein. Pancreas: Unremarkable. No pancreatic ductal dilatation or surrounding inflammatory changes. Spleen: Normal in size without focal abnormality. Adrenals/Urinary Tract: The adrenal glands are preserved. There are calcifications seen along the hilum of both kidneys but these appear to be along the course of the renal arteries and not in the renal collecting systems. No ureteral stones are identified. There is small extreme upper pole right-sided renal cyst. This measures 9 mm in diameter. No clear abnormal enhancement or nodularity. Hounsfield units of 13 on precontrast. Bosniak 1 lesion. No specific imaging follow-up. The calices are delicately cupped. No filling defect or dilatation. No urothelial thickening, filling defect. The ureters have normal course and caliber down to the bladder. Preserved contours of the urinary bladder. Only slight wall thickening  identified diffusely. Bladder is underdistended. Stomach/Bowel: There is moderate colonic stool. Normal retrocecal appendix. There is fluid distending the stomach. Small bowel is nondilated. Few right-sided colonic diverticula and sigmoid colon diverticula. Vascular/Lymphatic: Diffuse vascular calcifications identified along the aorta. Again there is significant calcifications along the course of the renal arteries, iliac vessels with areas of some stenosis. Please correlate with any particular symptoms. There is particular 50% stenosis of the left SFA origin as well as areas along the external and common iliac arteries. No specific abnormal lymph node enlargement identified in the abdomen and pelvis. Reproductive: Mildly enlarged prostate with mass effect along the base of the bladder. Please correlate with patient's PSA. Other: No abdominal wall hernia or abnormality. No abdominopelvic ascites. Musculoskeletal: Streak artifact related to the patient's fixation hardware at L4-5. Scattered degenerative changes identified including at the disc space of L3-4. IMPRESSION: Bosniak 1 upper pole right-sided renal cysts. No enhancing renal mass, collecting system dilatation or filling defect. No obstructing stones. Significant vascular calcifications identified along the aorta and branch vessels including diffusely along the renal arteries extending into the hilum of the kidneys. There are also areas of significant stenosis suggested along the iliac vessels and left SFA. Please correlate with any particular symptoms. Coronary artery calcifications. Please correlate for other coronary risk factors Few colonic diverticula Electronically Signed   By: Karen Kays M.D.   On: 12/08/2022 13:23    Procedure: flexible cystoscopy.  He was prepped with betadine and 2% lidocaine jelly and then given Cipro  po.  The scope was easily passed. The urethral is normal.  The prostate is short with minimal hyperplasia but a tight  bladder neck.  The bladder wall has mild trabeculation without mucosal lesions.  UO's are normal.    Assessment/Plan: Elevated PSA with benign exam.   The repeat PSA is back to baseline.   BPH with BOO.  He has some urgency but is otherwise voiding well.  His prostate is short but tight.   Erectile dysfunction.  With the BPH and ED I think he would be a good candidate for daily tadalafil.  I have sent a script and reviewed the side effects and instructions.     Microhematuria.  CT shows a small right renal cyst.  Cystoscopy is negative.   Peripheral and renovascular disease was noted on CT.   He has some symptoms suggestive of claudication.  I will refer him to vascular surgery.     Meds ordered this encounter  Medications   ciprofloxacin (CIPRO) tablet 500 mg   tadalafil (CIALIS) 5 MG tablet    Sig: Take 1 tablet (5 mg total) by mouth daily as needed for erectile dysfunction.    Dispense:  30 tablet    Refill:  11     Orders Placed This Encounter  Procedures   Microscopic Examination   Urinalysis, Routine w reflex microscopic   Ambulatory referral to Vascular Surgery    Referral Priority:   Routine    Referral Type:   Surgical    Referral Reason:   Specialty Services Required    Requested Specialty:   Vascular Surgery    Number of Visits Requested:   1   Cystoscopy     Return in about 3 months (around 03/29/2023).    CC: Dr. Quintin Alto.      Dale Fowler 12/29/2022

## 2023-01-04 ENCOUNTER — Ambulatory Visit: Payer: Medicare Other | Admitting: Urology

## 2023-01-10 DIAGNOSIS — S8001XD Contusion of right knee, subsequent encounter: Secondary | ICD-10-CM | POA: Diagnosis not present

## 2023-03-07 ENCOUNTER — Other Ambulatory Visit: Payer: Self-pay

## 2023-03-07 ENCOUNTER — Encounter: Payer: Medicare Other | Admitting: Vascular Surgery

## 2023-03-07 DIAGNOSIS — I771 Stricture of artery: Secondary | ICD-10-CM

## 2023-03-07 DIAGNOSIS — N2889 Other specified disorders of kidney and ureter: Secondary | ICD-10-CM

## 2023-04-02 ENCOUNTER — Other Ambulatory Visit: Payer: Self-pay | Admitting: *Deleted

## 2023-04-02 DIAGNOSIS — N2889 Other specified disorders of kidney and ureter: Secondary | ICD-10-CM

## 2023-04-02 DIAGNOSIS — I771 Stricture of artery: Secondary | ICD-10-CM

## 2023-04-04 ENCOUNTER — Other Ambulatory Visit (HOSPITAL_COMMUNITY): Payer: Self-pay

## 2023-04-04 ENCOUNTER — Inpatient Hospital Stay (HOSPITAL_COMMUNITY)
Admission: EM | Admit: 2023-04-04 | Discharge: 2023-04-05 | DRG: 322 | Disposition: A | Discharge status: Home / Self Care | Payer: Medicare Other | Source: Ambulatory Visit | Attending: Cardiovascular Disease | Admitting: Cardiovascular Disease

## 2023-04-04 ENCOUNTER — Encounter (HOSPITAL_COMMUNITY)
Admission: EM | Disposition: A | Discharge status: Home / Self Care | Payer: Self-pay | Source: Ambulatory Visit | Attending: Cardiovascular Disease

## 2023-04-04 ENCOUNTER — Encounter (HOSPITAL_COMMUNITY): Payer: Self-pay

## 2023-04-04 DIAGNOSIS — I2510 Atherosclerotic heart disease of native coronary artery without angina pectoris: Secondary | ICD-10-CM | POA: Diagnosis present

## 2023-04-04 DIAGNOSIS — Z720 Tobacco use: Secondary | ICD-10-CM | POA: Diagnosis not present

## 2023-04-04 DIAGNOSIS — I959 Hypotension, unspecified: Secondary | ICD-10-CM | POA: Diagnosis not present

## 2023-04-04 DIAGNOSIS — I213 ST elevation (STEMI) myocardial infarction of unspecified site: Secondary | ICD-10-CM | POA: Diagnosis not present

## 2023-04-04 DIAGNOSIS — Z8673 Personal history of transient ischemic attack (TIA), and cerebral infarction without residual deficits: Secondary | ICD-10-CM

## 2023-04-04 DIAGNOSIS — Z79899 Other long term (current) drug therapy: Secondary | ICD-10-CM

## 2023-04-04 DIAGNOSIS — I442 Atrioventricular block, complete: Secondary | ICD-10-CM

## 2023-04-04 DIAGNOSIS — I10 Essential (primary) hypertension: Secondary | ICD-10-CM

## 2023-04-04 DIAGNOSIS — E785 Hyperlipidemia, unspecified: Secondary | ICD-10-CM | POA: Diagnosis not present

## 2023-04-04 DIAGNOSIS — I499 Cardiac arrhythmia, unspecified: Secondary | ICD-10-CM | POA: Diagnosis not present

## 2023-04-04 DIAGNOSIS — F17210 Nicotine dependence, cigarettes, uncomplicated: Secondary | ICD-10-CM | POA: Diagnosis not present

## 2023-04-04 DIAGNOSIS — K219 Gastro-esophageal reflux disease without esophagitis: Secondary | ICD-10-CM | POA: Diagnosis not present

## 2023-04-04 DIAGNOSIS — Z833 Family history of diabetes mellitus: Secondary | ICD-10-CM

## 2023-04-04 DIAGNOSIS — G8929 Other chronic pain: Secondary | ICD-10-CM | POA: Diagnosis present

## 2023-04-04 DIAGNOSIS — R079 Chest pain, unspecified: Secondary | ICD-10-CM | POA: Diagnosis not present

## 2023-04-04 DIAGNOSIS — I739 Peripheral vascular disease, unspecified: Secondary | ICD-10-CM | POA: Diagnosis not present

## 2023-04-04 DIAGNOSIS — Z8249 Family history of ischemic heart disease and other diseases of the circulatory system: Secondary | ICD-10-CM

## 2023-04-04 DIAGNOSIS — Z91013 Allergy to seafood: Secondary | ICD-10-CM | POA: Diagnosis not present

## 2023-04-04 DIAGNOSIS — I2119 ST elevation (STEMI) myocardial infarction involving other coronary artery of inferior wall: Secondary | ICD-10-CM | POA: Diagnosis not present

## 2023-04-04 DIAGNOSIS — Z955 Presence of coronary angioplasty implant and graft: Secondary | ICD-10-CM

## 2023-04-04 DIAGNOSIS — I2111 ST elevation (STEMI) myocardial infarction involving right coronary artery: Secondary | ICD-10-CM | POA: Diagnosis not present

## 2023-04-04 HISTORY — PX: CORONARY/GRAFT ACUTE MI REVASCULARIZATION: CATH118305

## 2023-04-04 HISTORY — PX: LEFT HEART CATH AND CORONARY ANGIOGRAPHY: CATH118249

## 2023-04-04 LAB — LIPID PANEL
Cholesterol: 194 mg/dL (ref 0–200)
HDL: 38 mg/dL — ABNORMAL LOW (ref >40)
LDL Cholesterol: 143 mg/dL — ABNORMAL HIGH (ref 0–99)
Total CHOL/HDL Ratio: 5.1 RATIO
Triglycerides: 65 mg/dL (ref <150)
VLDL: 13 mg/dL (ref 0–40)

## 2023-04-04 LAB — POCT I-STAT, CHEM 8
BUN: 9 mg/dL (ref 6–20)
Calcium, Ion: 1.14 mmol/L — ABNORMAL LOW (ref 1.15–1.40)
Chloride: 83 mmol/L — ABNORMAL LOW (ref 98–111)
Creatinine, Ser: 0.7 mg/dL (ref 0.61–1.24)
Glucose, Bld: 110 mg/dL — ABNORMAL HIGH (ref 70–99)
HCT: 38 % — ABNORMAL LOW (ref 39.0–52.0)
Hemoglobin: 12.9 g/dL — ABNORMAL LOW (ref 13.0–17.0)
Potassium: 2.8 mmol/L — ABNORMAL LOW (ref 3.5–5.1)
Sodium: 123 mmol/L — ABNORMAL LOW (ref 135–145)
TCO2: 25 mmol/L (ref 22–32)

## 2023-04-04 LAB — CBC
HCT: 36.4 % — ABNORMAL LOW (ref 39.0–52.0)
Hemoglobin: 12.1 g/dL — ABNORMAL LOW (ref 13.0–17.0)
MCH: 30 pg (ref 26.0–34.0)
MCHC: 33.2 g/dL (ref 30.0–36.0)
MCV: 90.3 fL (ref 80.0–100.0)
Platelets: 237 10*3/uL (ref 150–400)
RBC: 4.03 MIL/uL — ABNORMAL LOW (ref 4.22–5.81)
RDW: 14.5 % (ref 11.5–15.5)
WBC: 12.5 10*3/uL — ABNORMAL HIGH (ref 4.0–10.5)
nRBC: 0 % (ref 0.0–0.2)

## 2023-04-04 LAB — BASIC METABOLIC PANEL
Anion gap: 10 (ref 5–15)
BUN: 7 mg/dL (ref 6–20)
CO2: 24 mmol/L (ref 22–32)
Calcium: 8.7 mg/dL — ABNORMAL LOW (ref 8.9–10.3)
Chloride: 105 mmol/L (ref 98–111)
Creatinine, Ser: 1.07 mg/dL (ref 0.61–1.24)
GFR, Estimated: >60 mL/min (ref >60)
Glucose, Bld: 112 mg/dL — ABNORMAL HIGH (ref 70–99)
Potassium: 3.7 mmol/L (ref 3.5–5.1)
Sodium: 139 mmol/L (ref 135–145)

## 2023-04-04 LAB — POCT ACTIVATED CLOTTING TIME
Activated Clotting Time: 513 seconds
Activated Clotting Time: 446 seconds

## 2023-04-04 LAB — HEMOGLOBIN A1C
Hgb A1c MFr Bld: 5.7 % — ABNORMAL HIGH (ref 4.8–5.6)
Mean Plasma Glucose: 116.89 mg/dL

## 2023-04-04 LAB — MRSA NEXT GEN BY PCR, NASAL: MRSA by PCR Next Gen: NOT DETECTED

## 2023-04-04 SURGERY — CORONARY/GRAFT ACUTE MI REVASCULARIZATION
Anesthesia: LOCAL

## 2023-04-04 MED ORDER — CHLORHEXIDINE GLUCONATE CLOTH 2 % EX PADS
6.0000 | MEDICATED_PAD | Freq: Every day | CUTANEOUS | Status: DC
  Administered 2023-04-04 – 2023-04-05 (×3): 6 via TOPICAL

## 2023-04-04 MED ORDER — POTASSIUM CHLORIDE 10 MEQ/100ML IV SOLN
INTRAVENOUS | Status: AC | PRN
  Administered 2023-04-04: 10 meq via INTRAVENOUS

## 2023-04-04 MED ORDER — VERAPAMIL HCL 2.5 MG/ML IV SOLN
INTRAVENOUS | Status: DC | PRN
  Administered 2023-04-04: 10 mL via INTRA_ARTERIAL

## 2023-04-04 MED ORDER — ONDANSETRON HCL 4 MG/2ML IJ SOLN
4.0000 mg | Freq: Four times a day (QID) | INTRAMUSCULAR | Status: DC | PRN
  Administered 2023-04-04: 4 mg via INTRAVENOUS
  Filled 2023-04-04: qty 2

## 2023-04-04 MED ORDER — METOPROLOL TARTRATE 5 MG/5ML IV SOLN
INTRAVENOUS | Status: AC
  Filled 2023-04-04: qty 5

## 2023-04-04 MED ORDER — METOPROLOL TARTRATE 5 MG/5ML IV SOLN
INTRAVENOUS | Status: DC | PRN
  Administered 2023-04-04: 2.5 mg via INTRAVENOUS

## 2023-04-04 MED ORDER — VERAPAMIL HCL 2.5 MG/ML IV SOLN
INTRAVENOUS | Status: AC
  Filled 2023-04-04: qty 2

## 2023-04-04 MED ORDER — METOPROLOL TARTRATE 12.5 MG HALF TABLET
12.5000 mg | ORAL_TABLET | Freq: Two times a day (BID) | ORAL | Status: DC
  Administered 2023-04-04 – 2023-04-05 (×3): 12.5 mg via ORAL
  Filled 2023-04-04 (×3): qty 1

## 2023-04-04 MED ORDER — LABETALOL HCL 5 MG/ML IV SOLN: 10.0000 mg | INTRAVENOUS | Status: AC | PRN

## 2023-04-04 MED ORDER — ATORVASTATIN CALCIUM 80 MG PO TABS
80.0000 mg | ORAL_TABLET | Freq: Every day | ORAL | Status: DC
  Administered 2023-04-04 – 2023-04-05 (×2): 80 mg via ORAL
  Filled 2023-04-04 (×2): qty 1

## 2023-04-04 MED ORDER — TICAGRELOR 90 MG PO TABS
90.0000 mg | ORAL_TABLET | Freq: Two times a day (BID) | ORAL | Status: DC
  Administered 2023-04-04 – 2023-04-05 (×2): 90 mg via ORAL
  Filled 2023-04-04 (×2): qty 1

## 2023-04-04 MED ORDER — HEPARIN SODIUM (PORCINE) 1000 UNIT/ML IJ SOLN
INTRAMUSCULAR | Status: DC | PRN
  Administered 2023-04-04: 8000 [IU] via INTRAVENOUS

## 2023-04-04 MED ORDER — HEPARIN (PORCINE) IN NACL 1000-0.9 UT/500ML-% IV SOLN
INTRAVENOUS | Status: DC | PRN
  Administered 2023-04-04 (×2): 500 mL

## 2023-04-04 MED ORDER — IOHEXOL 350 MG/ML SOLN
INTRAVENOUS | Status: DC | PRN
  Administered 2023-04-04: 320 mL via INTRA_ARTERIAL

## 2023-04-04 MED ORDER — HEPARIN SODIUM (PORCINE) 1000 UNIT/ML IJ SOLN
INTRAMUSCULAR | Status: AC
  Filled 2023-04-04: qty 10

## 2023-04-04 MED ORDER — SODIUM CHLORIDE 0.9% FLUSH: 3.0000 mL | INTRAVENOUS | Status: DC | PRN

## 2023-04-04 MED ORDER — ORAL CARE MOUTH RINSE: 15.0000 mL | OROMUCOSAL | Status: DC | PRN

## 2023-04-04 MED ORDER — ASPIRIN 81 MG PO CHEW
81.0000 mg | CHEWABLE_TABLET | Freq: Every day | ORAL | Status: DC
  Administered 2023-04-05: 81 mg via ORAL
  Filled 2023-04-04: qty 1

## 2023-04-04 MED ORDER — SODIUM CHLORIDE 0.9 % IV SOLN: INTRAVENOUS | Status: AC

## 2023-04-04 MED ORDER — TICAGRELOR 90 MG PO TABS
ORAL_TABLET | ORAL | Status: AC
  Filled 2023-04-04: qty 2

## 2023-04-04 MED ORDER — SODIUM CHLORIDE 0.9% FLUSH
3.0000 mL | Freq: Two times a day (BID) | INTRAVENOUS | Status: DC
  Administered 2023-04-04 – 2023-04-05 (×2): 3 mL via INTRAVENOUS

## 2023-04-04 MED ORDER — SODIUM CHLORIDE 0.9 % IV SOLN
INTRAVENOUS | Status: AC | PRN
  Administered 2023-04-04: 10 mL/h via INTRAVENOUS

## 2023-04-04 MED ORDER — LIDOCAINE HCL (PF) 1 % IJ SOLN
INTRAMUSCULAR | Status: DC | PRN
  Administered 2023-04-04: 2 mL

## 2023-04-04 MED ORDER — ACETAMINOPHEN 325 MG PO TABS: 650.0000 mg | ORAL_TABLET | ORAL | Status: DC | PRN

## 2023-04-04 MED ORDER — NICOTINE 14 MG/24HR TD PT24
14.0000 mg | MEDICATED_PATCH | Freq: Every day | TRANSDERMAL | Status: DC
  Administered 2023-04-04 – 2023-04-05 (×2): 14 mg via TRANSDERMAL
  Filled 2023-04-04 (×2): qty 1

## 2023-04-04 MED ORDER — SODIUM CHLORIDE 0.9 % IV SOLN: 250.0000 mL | INTRAVENOUS | Status: DC | PRN

## 2023-04-04 MED ORDER — ONDANSETRON HCL 4 MG/2ML IJ SOLN
INTRAMUSCULAR | Status: AC
  Filled 2023-04-04: qty 2

## 2023-04-04 MED ORDER — LIDOCAINE HCL (PF) 1 % IJ SOLN
INTRAMUSCULAR | Status: AC
  Filled 2023-04-04: qty 30

## 2023-04-04 MED ORDER — ONDANSETRON HCL 4 MG/2ML IJ SOLN
INTRAMUSCULAR | Status: DC | PRN
  Administered 2023-04-04: 4 mg via INTRAVENOUS

## 2023-04-04 MED ORDER — TICAGRELOR 90 MG PO TABS
ORAL_TABLET | ORAL | Status: DC | PRN
  Administered 2023-04-04: 180 mg via ORAL

## 2023-04-04 MED ORDER — ENOXAPARIN SODIUM 40 MG/0.4ML IJ SOSY
40.0000 mg | PREFILLED_SYRINGE | INTRAMUSCULAR | Status: DC
  Administered 2023-04-05: 40 mg via SUBCUTANEOUS
  Filled 2023-04-04: qty 0.4

## 2023-04-04 MED ORDER — HYDRALAZINE HCL 20 MG/ML IJ SOLN: 10.0000 mg | INTRAMUSCULAR | Status: AC | PRN

## 2023-04-04 SURGICAL SUPPLY — 23 items
BALLN EMERGE MR 2.5X12 (BALLOONS) ×1
BALLN ~~LOC~~ EMERGE MR 3.75X15 (BALLOONS) ×1
BALLOON EMERGE MR 2.5X12 (BALLOONS) IMPLANT
BALLOON ~~LOC~~ EMERGE MR 3.75X15 (BALLOONS) IMPLANT
CATH INFINITI AMBI 5FR TG (CATHETERS) IMPLANT
CATH LAUNCHER 6FR JR4 (CATHETERS) IMPLANT
CATH VISTA GUIDE 6FR AR1 SH (CATHETERS) IMPLANT
DEVICE RAD COMP TR BAND LRG (VASCULAR PRODUCTS) IMPLANT
GLIDESHEATH SLEND SS 6F .021 (SHEATH) IMPLANT
GUIDEWIRE INQWIRE 1.5J.035X260 (WIRE) IMPLANT
INQWIRE 1.5J .035X260CM (WIRE) ×1
KIT ENCORE 26 ADVANTAGE (KITS) IMPLANT
KIT HEART LEFT (KITS) ×1 IMPLANT
KIT MICROPUNCTURE NIT STIFF (SHEATH) IMPLANT
PACK CARDIAC CATHETERIZATION (CUSTOM PROCEDURE TRAY) ×1 IMPLANT
SHEATH PINNACLE 6F 10CM (SHEATH) IMPLANT
STENT SYNERGY XD 3.50X12 (Permanent Stent) IMPLANT
STENT SYNERGY XD 3.50X20 (Permanent Stent) IMPLANT
SYNERGY XD 3.50X12 (Permanent Stent) ×1 IMPLANT
SYNERGY XD 3.50X20 (Permanent Stent) ×1 IMPLANT
TRANSDUCER W/STOPCOCK (MISCELLANEOUS) ×1 IMPLANT
TUBING CIL FLEX 10 FLL-RA (TUBING) ×1 IMPLANT
WIRE COUGAR XT STRL 190CM (WIRE) IMPLANT

## 2023-04-04 NOTE — Progress Notes (Signed)
eLink Physician-Brief Progress Note Patient Name: Dale Fowler DOB: August 03, 1966 MRN: [id number]   Date of Service  04/04/2023  HPI/Events of Note  57 year old male with a history of peripheral vascular disease, tobacco use, metabolic syndrome who initially presents on 7/24 with chest pain and inferior STEMI.  He was emergently taken to the angiogram and a drug-eluting stent was placed in the mid RCA.  LVEDP is mildly elevated.  Dual antiplatelet therapy was initiated.  eICU Interventions  Dual antiplatelet therapy, echocardiogram, NicoDerm ordered  DVT prophylaxis with enoxaparin  No immediate intervention required     Intervention Category Evaluation Type: New Patient Evaluation  Sarann Tregre 04/04/2023, 6:03 AM

## 2023-04-04 NOTE — Progress Notes (Signed)
Pt reports he's still on bedrest.  Pt was educated on stent card, stent location, Antiplatelet and ASA use, wt restrictions, no baths/daily wash-ups, s/s of infection, ex guidelines, s/s to stop exercising, NTG use and calling 911, heart healthy diet, risk factors and CRPII. Pt received MI book and materials on exercise, diet, and CRPII. Will refer to [facility].  Referral placed to: [facility] under cardiologist Dr. Nicki Guadalajara Qualifying Dx: 7/24 STEMI; 7/24 PCI

## 2023-04-04 NOTE — Progress Notes (Signed)
Rounding Note    Patient Name: Dale Fowler Date of Encounter: 04/04/2023  [facility] Cardiologist: None ***  Subjective   ***  Inpatient Medications    Scheduled Meds:  [START ON 04/05/2023] aspirin  81 mg Oral Daily   atorvastatin  80 mg Oral Daily   Chlorhexidine Gluconate Cloth  6 each Topical Daily   [START ON 04/05/2023] enoxaparin (LOVENOX) injection  40 mg Subcutaneous Q24H   metoprolol tartrate  12.5 mg Oral BID   nicotine  14 mg Transdermal Q0600   sodium chloride flush  3 mL Intravenous Q12H   ticagrelor  90 mg Oral BID   Continuous Infusions:  sodium chloride     PRN Meds: sodium chloride, acetaminophen, ondansetron (ZOFRAN) IV, mouth rinse, sodium chloride flush   Vital Signs    Vitals:   04/04/23 2015 04/04/23 2030 04/04/23 2045 04/04/23 2100  BP: 134/79 122/87 111/82 102/70  Pulse: 77 (!) 54 64 70  Resp: 18 20 17  (!) 22  Temp:      TempSrc:      SpO2: 98% 97% 97% 94%    Intake/Output Summary (Last 24 hours) at 04/04/2023 2156 Last data filed at 04/04/2023 1200 Gross per 24 hour  Intake 770.47 ml  Output 800 ml  Net -29.53 ml      12/28/2022   11:15 AM 01/29/2013    3:32 PM 06/16/2011    7:55 AM  Last 3 Weights  Weight (lbs) 180 lb 162 lb 180 lb  Weight (kg) 81.647 kg 73.483 kg 81.647 kg      Telemetry    *** - Personally Reviewed  ECG    *** - Personally Reviewed  Physical Exam  *** GEN: No acute distress.   Neck: No JVD Cardiac: RRR, no murmurs, rubs, or gallops.  Respiratory: Clear to auscultation bilaterally. GI: Soft, nontender, non-distended  MS: No edema; No deformity. Neuro:  Nonfocal  Psych: Normal affect   Labs    High Sensitivity Troponin:  No results for input(s): "TROPONINIHS" in the last 720 hours.   Chemistry Recent Labs  Lab 04/04/23 0323 04/04/23 0616  NA 123* 139  K 2.8* 3.7  CL 83* 105  CO2  --  24  GLUCOSE 110* 112*  BUN 9 7  CREATININE 0.70 1.07  CALCIUM  --  8.7*  GFRNONAA  --   >60  ANIONGAP  --  10    Lipids  Recent Labs  Lab 04/04/23 0616  CHOL 194  TRIG 65  HDL 38*  LDLCALC 143*  CHOLHDL 5.1    Hematology Recent Labs  Lab 04/04/23 0323 04/04/23 0616  WBC  --  12.5*  RBC  --  4.03*  HGB 12.9* 12.1*  HCT 38.0* 36.4*  MCV  --  90.3  MCH  --  30.0  MCHC  --  33.2  RDW  --  14.5  PLT  --  237   Thyroid No results for input(s): "TSH", "FREET4" in the last 168 hours.  BNPNo results for input(s): "BNP", "PROBNP" in the last 168 hours.  DDimer No results for input(s): "DDIMER" in the last 168 hours.   Radiology    CARDIAC CATHETERIZATION  Result Date: 04/04/2023   Prox LAD lesion is 40% stenosed.   Mid LAD lesion is 40% stenosed.   Mid RCA lesion is 99% stenosed.   Prox RCA lesion is 40% stenosed.   1st Mrg lesion is 60% stenosed.   A drug-eluting stent was successfully placed.  Post intervention, there is a 0% residual stenosis.   Post intervention, there is a 0% residual stenosis.   The left ventricular systolic function is normal.   LV end diastolic pressure is mildly elevated.   The left ventricular ejection fraction is 50-55% by visual estimate.   Recommend uninterrupted dual antiplatelet therapy with Aspirin 81mg  daily and Ticagrelor 90mg  twice daily. Inferior ST segment elevation myocardial infarction secondary to initial total occlusion of a large dominant RCA. Mild concomitant CAD with 40% ostial LAD followed by 40% proximal stenosis and 20% mid stenosis.  The marginal/ramus like vessel has diffuse 60% mid stenosis.  Mild collateralization to the PDA from septal perforating arteries. Dominant RCA with superior takeoff and probable initial total occlusion of the RCA which had opened to 99% following heparinization and visualized on the initial injection.  There also was 40% stenosis proximal to the RV branch. Reperfusion associated idioventricular rhythm treated with IV Lopressor. Successful PCI to the RCA with ultimate insertion of a 3.5 x 20 and 3.5  x 12 mm Synergy stents postdilated to 3.71 mm with the stenoses being reduced to 0% and brisk TIMI-3 flow. RECOMMENDATION: DAPT with aspirin/Brilinta for minimum of 12 months.  Initiate beta-blocker therapy, and additional anti-ischemic medical therapy for concomitant CAD.  Aggressive lipid-lowering therapy with target LDL less than 55.  Smoking cessation is essential.    Cardiac Studies   ***  Patient Profile     57 y.o. male ***  Assessment & Plan    Inferior MI/CAD: s/p RCA stent for occluded RCA on 7/24.  HTN:  Hyperlipidemia:  Tobacco abuse:  For questions or updates, please contact [facility] Please consult [email or web address] for contact info under        Signed, Lance Muss, MD  04/04/2023, 9:56 PM

## 2023-04-04 NOTE — TOC Initial Note (Addendum)
Transition of Care [facility]) - Initial/Assessment Note    Patient Details  Name: Dale Fowler MRN: [id number] Date of Birth: 22-Jul-1966  [facility] CM/SW Contact:    Elliot Cousin, RN Phone Number: [phone] 04/04/2023, 1:19 PM  Clinical Narrative:                 CM spoke to pt and sisters at bedside. Pt states he plans to quit smoking. States he can discuss smoking cessations programs with MD and his PCP. States he has follow up with PCP on 04/09/2023 and plans to keep appt. His Brillinta copay $45. Will continue to follow for dc needs  Pt lost several of his brother/sisters in the past few years, he was tearful in the room. His sisters/CM prayed with patient. Discussed grief counseling. Sister states she has several contacts for grief counseling. Encouraged pt to follow with his PCP at next visit.    PCP will call to arrange a hospital follow up appt.   Expected Discharge Plan: Home/Self Care Barriers to Discharge: Continued Medical Work up   Patient Goals and CMS Choice Patient states their goals for this hospitalization and ongoing recovery are:: wants to recover          Expected Discharge Plan and Services   Discharge Planning Services: CM Consult   Living arrangements for the past 2 months: Single Family Home                                      Prior Living Arrangements/Services Living arrangements for the past 2 months: Single Family Home Lives with:: Significant Other Patient language and need for interpreter reviewed:: Yes Do you feel safe going back to the place where you live?: Yes      Need for Family Participation in Patient Care: No (Comment) Care giver support system in place?: Yes (comment)      Activities of Daily Living      Permission Sought/Granted Permission sought to share information with : Case Manager, Family Supports, PCP Permission granted to share information with : Yes, Verbal Permission  Granted  Share Information with NAME: Mariam Dollar     Permission granted to share info w Relationship: SO  Permission granted to share info w Contact Information: [phone]  Emotional Assessment Appearance:: Appears stated age Attitude/Demeanor/Rapport: Engaged Affect (typically observed): Accepting Orientation: : Oriented to Self, Oriented to Place, Oriented to  Time, Oriented to Situation   Psych Involvement: No (comment)  Admission diagnosis:  STEMI involving right coronary artery (HCC) [I21.11] Patient Active Problem List   Diagnosis Date Noted   STEMI involving right coronary artery (HCC) 04/04/2023   Hyperlipidemia LDL goal <70 04/04/2023   Primary hypertension 04/04/2023   Tobacco abuse 04/04/2023   Idioventricular rhythm (HCC) 04/04/2023   PVD (peripheral vascular disease) (HCC) 04/04/2023   PCP:  Lawerance Sabal, PA Pharmacy:   [facility] [place] - [address] [place] [address] Phone: [phone] Fax: [phone]     Social Determinants of Health (SDOH) Social History: SDOH Screenings   Food Insecurity: No Food Insecurity (04/04/2023)  Housing: Low Risk  (04/04/2023)  Transportation Needs: No Transportation Needs (04/04/2023)  Utilities: Not At Risk (04/04/2023)  Tobacco Use: High Risk (04/04/2023)   [facility]   SDOH Interventions: Food Insecurity Interventions: Intervention Not Indicated Housing Interventions: Intervention Not  Indicated Transportation Interventions: Intervention Not Indicated Utilities Interventions: Intervention Not Indicated   Readmission Risk Interventions     No data to display

## 2023-04-04 NOTE — TOC Benefit Eligibility Note (Signed)
Pharmacy Patient Advocate Encounter  Insurance verification completed.    The patient is insured through [facility] Medicare Part D  Ran test claim for Brilinta 90 mg and the current 30 day co-pay is $45.00.   This test claim was processed through [facility]- copay amounts may vary at other pharmacies due to pharmacy/plan contracts, or as the patient moves through the different stages of their insurance plan.    Roland Earl, CPHT Pharmacy Patient Advocate Specialist [facility] Patient Advocate Team Direct Number: [phone]  Fax: [phone]

## 2023-04-04 NOTE — H&P (Addendum)
Cardiology Admission History and Physical   Patient ID: Dale Fowler MRN: [id number]; DOB: 08-09-1966   Admission date: 04/04/2023  PCP:  Lawerance Sabal, PA   [facility] Providers Cardiologist:  None        Chief Complaint:  STEMI  Patient Profile:   Dale Fowler is a 57 y.o. male with peripheral vascular disease, tobacco abuse, HTN, HLD, GERD, chronic back pain and a history of stroke who is being seen 04/04/2023 for the evaluation of chest pain and STEMI.  History of Present Illness:   Dale Fowler reports intermittent chest pain throughout the day on 7/23 that he thought was gas pain.  He did not think too much of it as it would come and go on its own.  He states that he was able to complete his day without much of an issue.  He went to sleep and woke up around 11pm on 04/03/23.  He states that he was walk around his house and developed acute onset substernal chest pain, that was 7-8/10 in severity.  He immediately knew something was not right and drove himself to the emergency department accompanied by his girlfriend.  In the emergency room, EKG was notable for inferior ST elevation with reciprocal ST depression.  Code STEMI was activated and patient was promptly transferred over to [facility] where he underwent emergent coronary angiography with intervention.  At the time my interview post intervention, patient states that he is comfortable and denies any active chest pain, exertional chest pressure/discomfort, dyspnea/tachypnea, paroxysmal nocturnal dyspnea/orthopnea, irregular heart beat/palpitations, presyncope/syncope, lower extremity edema, claudication, or abdominal distention.  He is hemodynamically stable.     Past Medical History:  Diagnosis Date   GERD (gastroesophageal reflux disease)    Hyperlipidemia    Hypertension    does not see a cardiologist   TIA (transient ischemic attack)    X2    Past Surgical History:  Procedure Laterality Date    LUMBAR LAMINECTOMY/DECOMPRESSION MICRODISCECTOMY Left 01/30/2013   Procedure: LUMBAR LAMINECTOMY/DECOMPRESSION MICRODISCECTOMY 1 LEVEL;  Surgeon: Reinaldo Meeker, MD;  Location: [facility];  Service: Neurosurgery;  Laterality: Left;  lumbar four-five     Medications Prior to Admission: Prior to Admission medications   Medication Sig Start Date End Date Taking? Authorizing Provider  amLODipine (NORVASC) 10 MG tablet Take 10 mg by mouth daily.    [provider]  atorvastatin (LIPITOR) 40 MG tablet Take 40 mg by mouth at bedtime.    [provider]  metoprolol tartrate (LOPRESSOR) 25 MG tablet Take 25 mg by mouth daily.    [provider]  tadalafil (CIALIS) 5 MG tablet Take 1 tablet (5 mg total) by mouth daily as needed for erectile dysfunction. 12/28/22   Bjorn Pippin, MD     Allergies:    Allergies  Allergen Reactions   Shellfish Allergy     FACIAL EDEMA    Social History:   Social History   Socioeconomic History   Marital status: Divorced    Spouse name: Not on file   Number of children: Not on file   Years of education: Not on file   Highest education level: Not on file  Occupational History   Not on file  Tobacco Use   Smoking status: Every Day    Current packs/day: 0.50    Average packs/day: 0.5 packs/day for 18.0 years (9.0 ttl pk-yrs)    Types: Cigarettes   Smokeless tobacco: Not on file  Vaping Use   Vaping  status: Never Used  Substance and Sexual Activity   Alcohol use: Yes   Drug use: Yes   Sexual activity: Not on file  Other Topics Concern   Not on file  Social History Narrative   Not on file   Social Determinants of Health   Financial Resource Strain: Not on file  Food Insecurity: Not on file  Transportation Needs: Not on file  Physical Activity: Not on file  Stress: Not on file  Social Connections: Not on file  Intimate Partner Violence: Not on file    Family History:   The patient's family history includes Diabetes  in his mother; Hypertension in his mother.    ROS:  Please see the history of present illness.  All other ROS reviewed and negative.     Physical Exam/Data:   Vitals:   04/04/23 0401 04/04/23 0406 04/04/23 0415 04/04/23 0430  BP: (!) 136/91 (!) 136/91 128/85   Pulse: 78 (!) 0 81 85  Resp: (!) 26  14 20   Temp:   98.3 F (36.8 C)   TempSrc:   Oral   SpO2:   95% 97%    Intake/Output Summary (Last 24 hours) at 04/04/2023 0538 Last data filed at 04/04/2023 0400 Gross per 24 hour  Intake --  Output 200 ml  Net -200 ml      12/28/2022   11:15 AM 01/29/2013    3:32 PM 06/16/2011    7:55 AM  Last 3 Weights  Weight (lbs) 180 lb 162 lb 180 lb  Weight (kg) 81.647 kg 73.483 kg 81.647 kg     There is no height or weight on file to calculate BMI.  General:  Well nourished, well developed, in no acute distress HEENT: normal Neck: no JVD Cardiac:  normal S1, S2; RRR; no murmur/rubs/gallops appreciated Lungs:  clear to auscultation bilaterally, no wheezing, rhonchi or rales  Abd: soft, nontender, no hepatomegaly  Ext: no edema Skin: warm and dry  Neuro:  No focal abnormalities noted Psych:  Normal affect    Relevant CV Studies: 04/04/23 - Coronary Angiogram    Prox LAD lesion is 40% stenosed.   Mid LAD lesion is 40% stenosed.   Mid RCA lesion is 99% stenosed.   Prox RCA lesion is 40% stenosed.   1st Mrg lesion is 60% stenosed.   A drug-eluting stent was successfully placed.   Post intervention, there is a 0% residual stenosis.   Post intervention, there is a 0% residual stenosis.   The left ventricular systolic function is normal.   LV end diastolic pressure is mildly elevated.   The left ventricular ejection fraction is 50-55% by visual estimate.   Recommend uninterrupted dual antiplatelet therapy with Aspirin 81mg  daily and Ticagrelor 90mg  twice daily.  Laboratory Data:  High Sensitivity Troponin:  No results for input(s): "TROPONINIHS" in the last 720 hours.     ChemistryNo results for input(s): "NA", "K", "CL", "CO2", "GLUCOSE", "BUN", "CREATININE", "CALCIUM", "MG", "GFRNONAA", "GFRAA", "ANIONGAP" in the last 168 hours.  No results for input(s): "PROT", "ALBUMIN", "AST", "ALT", "ALKPHOS", "BILITOT" in the last 168 hours. Lipids No results for input(s): "CHOL", "TRIG", "HDL", "LABVLDL", "LDLCALC", "CHOLHDL" in the last 168 hours. HematologyNo results for input(s): "WBC", "RBC", "HGB", "HCT", "MCV", "MCH", "MCHC", "RDW", "PLT" in the last 168 hours. Thyroid No results for input(s): "TSH", "FREET4" in the last 168 hours. BNPNo results for input(s): "BNP", "PROBNP" in the last 168 hours.  DDimer No results for input(s): "DDIMER" in the last 168 hours.  Radiology/Studies:  CARDIAC CATHETERIZATION  Result Date: 04/04/2023   Prox LAD lesion is 40% stenosed.   Mid LAD lesion is 40% stenosed.   Mid RCA lesion is 99% stenosed.   Prox RCA lesion is 40% stenosed.   1st Mrg lesion is 60% stenosed.   A drug-eluting stent was successfully placed.   Post intervention, there is a 0% residual stenosis.   Post intervention, there is a 0% residual stenosis.   The left ventricular systolic function is normal.   LV end diastolic pressure is mildly elevated.   The left ventricular ejection fraction is 50-55% by visual estimate.   Recommend uninterrupted dual antiplatelet therapy with Aspirin 81mg  daily and Ticagrelor 90mg  twice daily. Inferior ST segment elevation myocardial infarction secondary to initial total occlusion of a large dominant RCA. Mild concomitant CAD with 40% ostial LAD followed by 40% proximal stenosis and 20% mid stenosis.  The marginal/ramus like vessel has diffuse 60% mid stenosis.  Mild collateralization to the PDA from septal perforating arteries. Dominant RCA with superior takeoff and probable initial total occlusion of the RCA which had opened to 99% following heparinization and visualized on the initial injection.  There also was 40% stenosis proximal to  the RV branch. Reperfusion associated idioventricular rhythm treated with IV Lopressor. Successful PCI to the RCA with ultimate insertion of a 3.5 x 20 and 3.5 x 12 mm Synergy stents postdilated to 3.71 mm with the stenoses being reduced to 0% and brisk TIMI-3 flow. RECOMMENDATION: DAPT with aspirin/Brilinta for minimum of 12 months.  Initiate beta-blocker therapy, and additional anti-ischemic medical therapy for concomitant CAD.  Aggressive lipid-lowering therapy with target LDL less than 55.  Smoking cessation is essential.     Assessment and Plan:    Dale Fowler is a 57 y.o. male with peripheral vascular disease, tobacco abuse, HTN, HLD, GERD, chronic back pain and a history of stroke who is being seen 04/04/2023 for the evaluation of chest pain and STEMI now s/p PCI of RCA.   # STEMI # History of peripheral vascular disease # Tobacco abuse # HTN # HLD # History of stroke - DAPT with aspirin and ticagrelor for one year - TTE in AM - Monitor on telemetry - Aggressive risk factor modification with glycemic control, smoking cessation, and GDMT (to be started once more objective data obtained; statin, ACEi, beta-blocker)             - Lipid panel and A1c pending   Risk Assessment/Risk Scores:    TIMI Risk Score for ST  Elevation MI:   The patient's TIMI risk score is 3, which indicates a 4.4% risk of all cause mortality at 30 days.       Code Status: Full Code  Severity of Illness: The appropriate patient status for this patient is INPATIENT. Inpatient status is judged to be reasonable and necessary in order to provide the required intensity of service to ensure the patient's safety. The patient's presenting symptoms, physical exam findings, and initial radiographic and laboratory data in the context of their chronic comorbidities is felt to place them at high risk for further clinical deterioration. Furthermore, it is not anticipated that the patient will be medically stable for  discharge from the hospital within 2 midnights of admission.   * I certify that at the point of admission it is my clinical judgment that the patient will require inpatient hospital care spanning beyond 2 midnights from the point of admission due to high intensity of service, high  risk for further deterioration and high frequency of surveillance required.*   For questions or updates, please contact  [facility] Please consult [email or web address] for contact info under     Signed, Ralene Ok, MD  04/04/2023 5:38 AM    Patient seen and examined. Agree with assessment and plan.  Dale Fowler is a 57 year old African-American male who has a history of hypertension, hyperlipidemia, chronic back pain, previously documented peripheral vascular disease with iliac stenosis, remote TIA 2019, who developed new onset chest discomfort.  He had experienced intermittent chest pain yesterday at approximately 1 AM, developed severe substernal chest discomfort.  He presented to [facility] ER and was found to have acute ST elevation in the inferior leads with T wave inversion in leads I and aVL anteroseptally.  A code STEMI was activated.  He was given heparin 4000 units and started on a drip in addition to aspirin.  On arrival to [facility], he was still having significant chest discomfort.  ECG on monitor now showed progressive ST elevation in the inferior leads.  I saw him on presentation in the emergency room.  HEENT was unremarkable.  There was no JVD.  His lungs were clear.  Rhythm was regular without ectopy.  Abdomen was soft and nontender.  There was no significant edema.  He was taken acutely to the catheterization laboratory by me.  He was given Zofran for significant nausea and following additional heparinization, his ST segment elevation significantly improved.  Angiography at that time showed subtotal 99% RCA stenosis.  His distal RCA was collateralized partially by the left coronary system.  He  had mildly to moderately, disease in the left circulation.  With reperfusion he developed idioventricular rhythm which responded to IV beta-blocker therapy.  Ultimately underwent successful stenting of a large dominant RCA with insertion of 3.5 x 20 and 3.5 x 12 mm DES stents.  I reviewed the angiographic findings with family members.  Patient will need to continue DAPT for minimum of 1 year.  He will undergo echo Doppler study.  We will continue with metoprolol and possibly add additional anti-ischemic medication for his concomitant CAD.  I discussed the absolute importance of complete smoking cessation.  Recommend aggressive lipid management with target LDL less than 55.   Lennette Bihari, MD, [facility] 04/04/2023 7:46 AM

## 2023-04-05 ENCOUNTER — Encounter (HOSPITAL_COMMUNITY): Payer: Self-pay | Admitting: Cardiovascular Disease

## 2023-04-05 ENCOUNTER — Telehealth: Payer: Self-pay | Admitting: Cardiology

## 2023-04-05 ENCOUNTER — Other Ambulatory Visit: Payer: Self-pay

## 2023-04-05 ENCOUNTER — Inpatient Hospital Stay (HOSPITAL_COMMUNITY): Payer: Medicare Other

## 2023-04-05 ENCOUNTER — Other Ambulatory Visit (HOSPITAL_COMMUNITY): Payer: Self-pay

## 2023-04-05 DIAGNOSIS — I739 Peripheral vascular disease, unspecified: Secondary | ICD-10-CM | POA: Diagnosis not present

## 2023-04-05 DIAGNOSIS — I10 Essential (primary) hypertension: Secondary | ICD-10-CM | POA: Diagnosis not present

## 2023-04-05 DIAGNOSIS — I2111 ST elevation (STEMI) myocardial infarction involving right coronary artery: Secondary | ICD-10-CM | POA: Diagnosis not present

## 2023-04-05 DIAGNOSIS — E785 Hyperlipidemia, unspecified: Secondary | ICD-10-CM | POA: Diagnosis not present

## 2023-04-05 LAB — CBC
HCT: 39.1 % (ref 39.0–52.0)
Hemoglobin: 12.8 g/dL — ABNORMAL LOW (ref 13.0–17.0)
MCH: 30.5 pg (ref 26.0–34.0)
MCHC: 32.7 g/dL (ref 30.0–36.0)
MCV: 93.3 fL (ref 80.0–100.0)
Platelets: 291 10*3/uL (ref 150–400)
RBC: 4.19 MIL/uL — ABNORMAL LOW (ref 4.22–5.81)
RDW: 14.6 % (ref 11.5–15.5)
WBC: 10.2 10*3/uL (ref 4.0–10.5)
nRBC: 0 % (ref 0.0–0.2)

## 2023-04-05 LAB — BASIC METABOLIC PANEL
Anion gap: 12 (ref 5–15)
BUN: 8 mg/dL (ref 6–20)
CO2: 23 mmol/L (ref 22–32)
Calcium: 9.3 mg/dL (ref 8.9–10.3)
Chloride: 102 mmol/L (ref 98–111)
Creatinine, Ser: 1.17 mg/dL (ref 0.61–1.24)
GFR, Estimated: >60 mL/min (ref >60)
Glucose, Bld: 123 mg/dL — ABNORMAL HIGH (ref 70–99)
Potassium: 3.6 mmol/L (ref 3.5–5.1)
Sodium: 137 mmol/L (ref 135–145)

## 2023-04-05 LAB — ECHOCARDIOGRAM COMPLETE
Area-P 1/2: 3.28 cm2
Est EF: 55
MV M vel: 5.18 m/s
MV Peak grad: 107.3 mmHg
Radius: 0.6 cm
S' Lateral: 3.4 cm

## 2023-04-05 MED ORDER — ASPIRIN 81 MG PO CHEW
81.0000 mg | CHEWABLE_TABLET | Freq: Every day | ORAL | 2 refills | Status: AC
  Filled 2023-04-05: qty 90, 90d supply, fill #0

## 2023-04-05 MED ORDER — METOPROLOL TARTRATE 25 MG PO TABS
12.5000 mg | ORAL_TABLET | Freq: Two times a day (BID) | ORAL | 0 refills | Status: DC
  Filled 2023-04-05: qty 90, 90d supply, fill #0

## 2023-04-05 MED ORDER — NICOTINE 14 MG/24HR TD PT24
14.0000 mg | MEDICATED_PATCH | Freq: Every day | TRANSDERMAL | 0 refills | Status: DC
Start: 2023-04-06 — End: 2024-07-16
  Filled 2023-04-05: qty 28, 28d supply, fill #0

## 2023-04-05 MED ORDER — NITROGLYCERIN 0.4 MG SL SUBL
0.4000 mg | SUBLINGUAL_TABLET | SUBLINGUAL | 2 refills | Status: AC | PRN
Start: 2023-04-05 — End: ?
  Filled 2023-04-05: qty 25, 5d supply, fill #0

## 2023-04-05 MED ORDER — TICAGRELOR 90 MG PO TABS
90.0000 mg | ORAL_TABLET | Freq: Two times a day (BID) | ORAL | 2 refills | Status: DC
Start: 2023-04-05 — End: 2023-04-19
  Filled 2023-04-05: qty 60, 30d supply, fill #0

## 2023-04-05 MED ORDER — ATORVASTATIN CALCIUM 80 MG PO TABS
80.0000 mg | ORAL_TABLET | Freq: Every day | ORAL | 1 refills | Status: DC
  Filled 2023-04-05: qty 90, 90d supply, fill #0

## 2023-04-05 NOTE — Plan of Care (Signed)

## 2023-04-05 NOTE — Telephone Encounter (Signed)
   Transition of Care Follow-up Phone Call Request    Patient Name: Dale Fowler Date of Birth: [birth year] Date of Encounter: 04/05/2023  Primary Care Provider:  Lawerance Sabal, PA Primary Cardiologist:  None  [name] has been scheduled for a transition of care follow up appointment with a [facility] provider:  Sharlene Dory 8/9  Please reach out to [facility] within 48 hours to confirm appointment and review transition of care protocol questionnaire.  Laverda Page, NP  04/05/2023, 1:41 PM

## 2023-04-05 NOTE — Progress Notes (Signed)
Echocardiogram 2D Echocardiogram has been performed.  Warren Lacy Dusty Raczkowski RDCS 04/05/2023, 9:36 AM

## 2023-04-05 NOTE — Progress Notes (Signed)
CARDIAC REHAB PHASE I   PRE:  Rate/Rhythm: 78 NSR  BP:  Sitting: 151/88      SaO2: 97 RA  MODE:  Ambulation: 370 ft   AD:   no AD  POST:  Rate/Rhythm: 95 NSR  BP:  Sitting: 150/99      SaO2: 98 RA  Pt amb with supervision assistance, pt denies CP and SOB during amb and was returned to room w/o complaint.   Discussed ways to reduce risk factors using diet and exercise. Pt received HH diet, Pritikin diet plan and information about CRP2. Pt has been referred to [facility].  Faustino Congress  ACSM-CEP 10:39 AM 04/05/2023

## 2023-04-05 NOTE — Discharge Summary (Addendum)
Discharge Summary    Patient ID: Dale Fowler MRN: [id number]; DOB: 03-01-1966  Admit date: 04/04/2023 Discharge date: 04/05/2023  PCP:  Lawerance Sabal, PA   [facility] Providers Cardiologist:  Dr. Tresa Endo, to follow up in [facility]  Discharge Diagnoses    Principal Problem:   STEMI involving right coronary artery [facility]) Active Problems:   Hyperlipidemia LDL goal <70   Primary hypertension   Tobacco abuse   Idioventricular rhythm (HCC)   PVD (peripheral vascular disease) [facility])  Diagnostic Studies/Procedures    Cath: 04/04/2023    Prox LAD lesion is 40% stenosed.   Mid LAD lesion is 40% stenosed.   Mid RCA lesion is 99% stenosed.   Prox RCA lesion is 40% stenosed.   1st Mrg lesion is 60% stenosed.   A drug-eluting stent was successfully placed.   Post intervention, there is a 0% residual stenosis.   Post intervention, there is a 0% residual stenosis.   The left ventricular systolic function is normal.   LV end diastolic pressure is mildly elevated.   The left ventricular ejection fraction is 50-55% by visual estimate.   Recommend uninterrupted dual antiplatelet therapy with Aspirin 81mg  daily and Ticagrelor 90mg  twice daily.   Inferior ST segment elevation myocardial infarction secondary to initial total occlusion of a large dominant RCA.   Mild concomitant CAD with 40% ostial LAD followed by 40% proximal stenosis and 20% mid stenosis.  The marginal/ramus like vessel has diffuse 60% mid stenosis.  Mild collateralization to the PDA from septal perforating arteries.   Dominant RCA with superior takeoff and probable initial total occlusion of the RCA which had opened to 99% following heparinization and visualized on the initial injection.  There also was 40% stenosis proximal to the RV branch.   Reperfusion associated idioventricular rhythm treated with IV Lopressor.   Successful PCI to the RCA with ultimate insertion of a 3.5 x 20 and 3.5 x 12 mm Synergy stents  postdilated to 3.71 mm with the stenoses being reduced to 0% and brisk TIMI-3 flow.   RECOMMENDATION: DAPT with aspirin/Brilinta for minimum of 12 months.  Initiate beta-blocker therapy, and additional anti-ischemic medical therapy for concomitant CAD.  Aggressive lipid-lowering therapy with target LDL less than 55.  Smoking cessation is essential.   Diagnostic Dominance: Right  Intervention    Echo: 04/05/2023  IMPRESSIONS     1. Left ventricular ejection fraction, by estimation, is 55%. The left  ventricle has normal function. The left ventricle demonstrates regional  wall motion abnormalities with basal inferior and basal inferolateral  severe hypokinesis. Left ventricular  diastolic parameters are consistent with Grade II diastolic dysfunction  (pseudonormalization).   2. Right ventricular systolic function is normal. The right ventricular  size is not well visualized. There is normal pulmonary artery systolic  pressure. The estimated right ventricular systolic pressure is 21.3 mmHg.   3. The mitral valve is abnormal, there is restriction of the posterior  leaflet that may be related to recent MI with inferior and inferolateral  wall motion abnormalities. Mild to moderate mitral valve regurgitation. No  evidence of mitral stenosis.   4. The aortic valve is tricuspid. There is mild calcification of the  aortic valve. Aortic valve regurgitation is not visualized. No aortic  stenosis is present.   5. The inferior vena cava is normal in size with greater than 50%  respiratory variability, suggesting right atrial pressure of 3 mmHg.   FINDINGS   Left Ventricle: Left ventricular ejection fraction, by  estimation, is  55%. The left ventricle has normal function. The left ventricle  demonstrates regional wall motion abnormalities. The left ventricular  internal cavity size was normal in size. There is   no left ventricular hypertrophy. Left ventricular diastolic parameters  are  consistent with Grade II diastolic dysfunction (pseudonormalization).   Right Ventricle: The right ventricular size is not well visualized. No  increase in right ventricular wall thickness. Right ventricular systolic  function is normal. There is normal pulmonary artery systolic pressure.  The tricuspid regurgitant velocity is  2.14 m/s, and with an assumed right atrial pressure of 3 mmHg, the  estimated right ventricular systolic pressure is 21.3 mmHg.   Left Atrium: Left atrial size was normal in size.   Right Atrium: Right atrial size was normal in size.   Pericardium: There is no evidence of pericardial effusion.   Mitral Valve: The mitral valve is abnormal. Mild to moderate mitral valve  regurgitation. No evidence of mitral valve stenosis.   Tricuspid Valve: The tricuspid valve is normal in structure. Tricuspid  valve regurgitation is trivial.   Aortic Valve: The aortic valve is tricuspid. There is mild calcification  of the aortic valve. Aortic valve regurgitation is not visualized. No  aortic stenosis is present.   Pulmonic Valve: The pulmonic valve was normal in structure. Pulmonic valve  regurgitation is not visualized.   Aorta: The aortic root and ascending aorta are structurally normal, with  no evidence of dilitation.   Venous: The inferior vena cava is normal in size with greater than 50%  respiratory variability, suggesting right atrial pressure of 3 mmHg.   IAS/Shunts: The atrial septum is grossly normal.   _____________   History of Present Illness     Dale Fowler is a 57 y.o. male with peripheral vascular disease, tobacco abuse, HTN, HLD, GERD, chronic back pain and a history of stroke who was seen 04/04/2023 for the evaluation of chest pain and STEMI.   [name] reported intermittent chest pain throughout the day on 7/23 that he thought was gas pain.  He did not think too much of it as it would come and go on its own.  He stated that he was able to  complete his day without much of an issue.  He went to sleep and woke up around 11pm on 04/03/23.  He stated that he was walk around his house and developed acute onset substernal chest pain, that was 7-8/10 in severity.  He immediately knew something was not right and drove himself to the emergency department accompanied by his girlfriend.   In the emergency room, EKG was notable for inferior ST elevation with reciprocal ST depression.  Code STEMI was activated and patient was promptly transferred over to [facility] where he underwent emergent coronary angiography with intervention.    Hospital Course     Inferior STEMI -- Underwent cardiac catheterization noted above with total occlusion of RCA treated with PCI/DES x 1.  Recommendations for DAPT with aspirin/Brilinta for at least 1 year.  No recurrent chest pain.  Seen by cardiac rehab. Follow up echo showed LVEF of 55 to 60%, hypokinesis of the inferior and basal inferior lateral wall, grade 2 diastolic dysfunction, normal RV, mild to moderate MR.  -- Continue aspirin, Brilinta, atorvastatin 80 mg, metoprolol 12.5 mg twice daily  HTN -- Stable -- Continue metoprolol 12.5 mg twice daily, consider further addition of therapy pending blood pressure at follow-up  HLD -- LDL 143, HDL 38 -- Continue  atorvastatin 80 mg daily -- LFT/FLP in 8 weeks  PAD -- Reports a history of claudication, missed his vascular appointment back in July.  Does have upcoming appointment with Dr. Myra Gianotti -- Continue on aspirin, statin, smoking cessation advised  Tobacco use -- Cessation advised  Patient was seen by Dr. Eldridge Dace and deemed stable for discharge home. Follow up arranged in the office. Medications sent to the [facility] pharmacy prior to discharge. Educated by pharmD.  Did the patient have an acute coronary syndrome (MI, NSTEMI, STEMI, etc) this admission?:  Yes                               AHA/ACC Clinical Performance & Quality Measures: Aspirin  prescribed? - Yes ADP Receptor Inhibitor (Plavix/Clopidogrel, Brilinta/Ticagrelor or Effient/Prasugrel) prescribed (includes medically managed patients)? - Yes Beta Blocker prescribed? - Yes High Intensity Statin (Lipitor 40-80mg  or Crestor 20-40mg ) prescribed? - Yes EF assessed during THIS hospitalization? - Yes For EF <40%, was ACEI/ARB prescribed? - Not Applicable (EF >/= 40%) For EF <40%, Aldosterone Antagonist (Spironolactone or Eplerenone) prescribed? - Not Applicable (EF >/= 40%) Cardiac Rehab Phase II ordered (including medically managed patients)? - Yes     The patient will be scheduled for a TOC follow up appointment in 10-14 days.  A message has been sent to the [facility] and Scheduling Pool at the office where the patient should be seen for follow up.  _____________  Discharge Vitals Blood pressure 128/79, pulse 67, temperature 98.7 F (37.1 C), temperature source Oral, resp. rate 14, SpO2 97%.  There were no vitals filed for this visit.  Labs & Radiologic Studies    CBC Recent Labs    04/04/23 0616 04/05/23 0800  WBC 12.5* 10.2  HGB 12.1* 12.8*  HCT 36.4* 39.1  MCV 90.3 93.3  PLT 237 291   Basic Metabolic Panel Recent Labs    13/08/65 0616 04/05/23 0800  NA 139 137  K 3.7 3.6  CL 105 102  CO2 24 23  GLUCOSE 112* 123*  BUN 7 8  CREATININE 1.07 1.17  CALCIUM 8.7* 9.3   Liver Function Tests No results for input(s): "AST", "ALT", "ALKPHOS", "BILITOT", "PROT", "ALBUMIN" in the last 72 hours. No results for input(s): "LIPASE", "AMYLASE" in the last 72 hours. High Sensitivity Troponin:   No results for input(s): "TROPONINIHS" in the last 720 hours.  BNP Invalid input(s): "POCBNP" D-Dimer No results for input(s): "DDIMER" in the last 72 hours. Hemoglobin A1C Recent Labs    04/04/23 0616  HGBA1C 5.7*   Fasting Lipid Panel Recent Labs    04/04/23 0616  CHOL 194  HDL 38*  LDLCALC 143*  TRIG 65  CHOLHDL 5.1   Thyroid Function Tests No results for  input(s): "TSH", "T4TOTAL", "T3FREE", "THYROIDAB" in the last 72 hours.  Invalid input(s): "FREET3" _____________  ECHOCARDIOGRAM COMPLETE  Result Date: 04/05/2023    ECHOCARDIOGRAM REPORT   Patient Name:   Dale Fowler Date of Exam: 04/05/2023 Medical Rec #:  [id number]       Height:       68.0 in Accession #:    [id number]      Weight:       180.0 lb Date of Birth:  1966-03-21      BSA:          1.954 m Patient Age:    56 years        BP:  134/101 mmHg Patient Gender: M               HR:           62 bpm. Exam Location:  Inpatient Procedure: 2D Echo, Color Doppler and Cardiac Doppler Indications:    Acute MI i21.9  History:        Patient has prior history of Echocardiogram examinations, most                 recent 12/29/2017. CAD; Risk Factors:Hypertension and                 Dyslipidemia.  Sonographer:    Irving Burton Senior RDCS Referring Phys: [id number] Donnie Coffin [place] IMPRESSIONS  1. Left ventricular ejection fraction, by estimation, is 55%. The left ventricle has normal function. The left ventricle demonstrates regional wall motion abnormalities with basal inferior and basal inferolateral severe hypokinesis. Left ventricular diastolic parameters are consistent with Grade II diastolic dysfunction (pseudonormalization).  2. Right ventricular systolic function is normal. The right ventricular size is not well visualized. There is normal pulmonary artery systolic pressure. The estimated right ventricular systolic pressure is 21.3 mmHg.  3. The mitral valve is abnormal, there is restriction of the posterior leaflet that may be related to recent MI with inferior and inferolateral wall motion abnormalities. Mild to moderate mitral valve regurgitation. No evidence of mitral stenosis.  4. The aortic valve is tricuspid. There is mild calcification of the aortic valve. Aortic valve regurgitation is not visualized. No aortic stenosis is present.  5. The inferior vena cava is normal in size with greater than 50%  respiratory variability, suggesting right atrial pressure of 3 mmHg. FINDINGS  Left Ventricle: Left ventricular ejection fraction, by estimation, is 55%. The left ventricle has normal function. The left ventricle demonstrates regional wall motion abnormalities. The left ventricular internal cavity size was normal in size. There is  no left ventricular hypertrophy. Left ventricular diastolic parameters are consistent with Grade II diastolic dysfunction (pseudonormalization). Right Ventricle: The right ventricular size is not well visualized. No increase in right ventricular wall thickness. Right ventricular systolic function is normal. There is normal pulmonary artery systolic pressure. The tricuspid regurgitant velocity is 2.14 m/s, and with an assumed right atrial pressure of 3 mmHg, the estimated right ventricular systolic pressure is 21.3 mmHg. Left Atrium: Left atrial size was normal in size. Right Atrium: Right atrial size was normal in size. Pericardium: There is no evidence of pericardial effusion. Mitral Valve: The mitral valve is abnormal. Mild to moderate mitral valve regurgitation. No evidence of mitral valve stenosis. Tricuspid Valve: The tricuspid valve is normal in structure. Tricuspid valve regurgitation is trivial. Aortic Valve: The aortic valve is tricuspid. There is mild calcification of the aortic valve. Aortic valve regurgitation is not visualized. No aortic stenosis is present. Pulmonic Valve: The pulmonic valve was normal in structure. Pulmonic valve regurgitation is not visualized. Aorta: The aortic root and ascending aorta are structurally normal, with no evidence of dilitation. Venous: The inferior vena cava is normal in size with greater than 50% respiratory variability, suggesting right atrial pressure of 3 mmHg. IAS/Shunts: The atrial septum is grossly normal.  LEFT VENTRICLE PLAX 2D LVIDd:         4.70 cm   Diastology LVIDs:         3.40 cm   LV e' medial:    5.77 cm/s LV PW:          1.00 cm   LV E/e' medial:  16.5 LV IVS:        1.00 cm   LV e' lateral:   7.62 cm/s LVOT diam:     2.00 cm   LV E/e' lateral: 12.5 LV SV:         53 LV SV Index:   27 LVOT Area:     3.14 cm  RIGHT VENTRICLE RV S prime:     11.70 cm/s TAPSE (M-mode): 2.2 cm LEFT ATRIUM             Index        RIGHT ATRIUM           Index LA diam:        3.40 cm 1.74 cm/m   RA Area:     15.60 cm LA Vol (A2C):   37.8 ml 19.34 ml/m  RA Volume:   42.40 ml  21.70 ml/m LA Vol (A4C):   44.7 ml 22.87 ml/m LA Biplane Vol: 42.7 ml 21.85 ml/m  AORTIC VALVE LVOT Vmax:   76.40 cm/s LVOT Vmean:  58.300 cm/s LVOT VTI:    0.170 m  AORTA Ao Root diam: 3.30 cm Ao Asc diam:  3.40 cm MITRAL VALVE                  TRICUSPID VALVE MV Area (PHT): 3.28 cm       TR Peak grad:   18.3 mmHg MV Decel Time: 231 msec       TR Vmax:        214.00 cm/s MR Peak grad:    107.3 mmHg MR Mean grad:    74.0 mmHg    SHUNTS MR Vmax:         518.00 cm/s  Systemic VTI:  0.17 m MR Vmean:        416.0 cm/s   Systemic Diam: 2.00 cm MR PISA:         2.26 cm MR PISA Eff ROA: 13 mm MR PISA Radius:  0.60 cm MV E velocity: 95.10 cm/s MV A velocity: 87.80 cm/s MV E/A ratio:  1.08 Dalton McleanMD Electronically signed by Wilfred Lacy Signature Date/Time: 04/05/2023/1:31:31 PM    Final    CARDIAC CATHETERIZATION  Result Date: 04/04/2023   Prox LAD lesion is 40% stenosed.   Mid LAD lesion is 40% stenosed.   Mid RCA lesion is 99% stenosed.   Prox RCA lesion is 40% stenosed.   1st Mrg lesion is 60% stenosed.   A drug-eluting stent was successfully placed.   Post intervention, there is a 0% residual stenosis.   Post intervention, there is a 0% residual stenosis.   The left ventricular systolic function is normal.   LV end diastolic pressure is mildly elevated.   The left ventricular ejection fraction is 50-55% by visual estimate.   Recommend uninterrupted dual antiplatelet therapy with Aspirin 81mg  daily and Ticagrelor 90mg  twice daily. Inferior ST segment elevation  myocardial infarction secondary to initial total occlusion of a large dominant RCA. Mild concomitant CAD with 40% ostial LAD followed by 40% proximal stenosis and 20% mid stenosis.  The marginal/ramus like vessel has diffuse 60% mid stenosis.  Mild collateralization to the PDA from septal perforating arteries. Dominant RCA with superior takeoff and probable initial total occlusion of the RCA which had opened to 99% following heparinization and visualized on the initial injection.  There also was 40% stenosis proximal to the RV branch. Reperfusion associated idioventricular rhythm treated with IV Lopressor. Successful PCI to the RCA with  ultimate insertion of a 3.5 x 20 and 3.5 x 12 mm Synergy stents postdilated to 3.71 mm with the stenoses being reduced to 0% and brisk TIMI-3 flow. RECOMMENDATION: DAPT with aspirin/Brilinta for minimum of 12 months.  Initiate beta-blocker therapy, and additional anti-ischemic medical therapy for concomitant CAD.  Aggressive lipid-lowering therapy with target LDL less than 55.  Smoking cessation is essential.   Disposition   Pt is being discharged home today in good condition.  Follow-up Plans & Appointments     Follow-up Information     Lawerance Sabal, [place] Follow up.   Specialty: Family Medicine Why: appt scheduled for 04/09/2023 at 1245 the office will call to arrange a hospital follow up appt, this appt time may change Contact information: [address] [place] [address] [phone]         Sharlene Dory, NP Follow up on 04/19/2023.   Specialty: Cardiology Why: at 10am for your follow up appt with cardiology Contact information: [address] [place] [address] [phone]                Discharge Instructions     AMB Referral to Advanced Lipid Disorders Clinic   Complete by: As directed    Internal Lipid Clinic Referral Scheduling  Internal lipid clinic referrals are providers within [facility], who wish to refer established patients  for routine management (help in starting PCSK9 inhibitor therapy) or advanced therapies.  Internal MD referral criteria:              1. All patients with LDL>190 mg/dL  2. All patients with Triglycerides >500 mg/dL  3. Patients with suspected or confirmed heterozygous familial hyperlipidemia (HeFH) or homozygous familial hyperlipidemia (HoFH)  4. Patients with family history of suspicious for genetic dyslipidemia desiring genetic testing  5. Patients refractory to standard guideline based therapy  6. Patients with statin intolerance (failed 2 statins, one of which must be a high potency statin)  7. Patients who the provider desires to be seen by MD   Internal PharmD referral criteria:   1. Follow-up patients for medication management  2. Follow-up for compliance monitoring  3. Patients for drug education  4. Patients with statin intolerance  5. PCSK9 inhibitor education and prior authorization approvals  6. Patients with triglycerides <500 mg/dL  External Lipid Clinic Referral  External lipid clinic referrals are for providers outside of [facility], considered new clinic patients - automatically routed to MD schedule   Amb Referral to Cardiac Rehabilitation   Complete by: As directed    Diagnosis:  Coronary Stents STEMI     After initial evaluation and assessments completed: Virtual Based Care may be provided alone or in conjunction with Phase 2 Cardiac Rehab based on patient barriers.: Yes   Intensive Cardiac Rehabilitation (ICR) [facility] location only OR Traditional Cardiac Rehabilitation (TCR) *If criteria for ICR are not met will enroll in TCR [facility] only): Yes   Call MD for:  difficulty breathing, headache or visual disturbances   Complete by: As directed    Call MD for:  persistant dizziness or light-headedness   Complete by: As directed    Call MD for:  redness, tenderness, or signs of infection (pain, swelling, redness, odor or green/yellow discharge around incision site)    Complete by: As directed    Diet - low sodium heart healthy   Complete by: As directed    Discharge instructions   Complete by: As directed    Radial Site Care  Refer to this sheet in the next few weeks. These instructions provide you with information on caring for yourself after your procedure. Your caregiver may also give you more specific instructions. Your treatment has been planned according to current medical practices, but problems sometimes occur. Call your caregiver if you have any problems or questions after your procedure. HOME CARE INSTRUCTIONS You may shower the day after the procedure. Remove the bandage (dressing) and gently wash the site with plain soap and water. Gently pat the site dry.  Do not apply powder or lotion to the site.  Do not submerge the affected site in water for 3 to 5 days.  Inspect the site at least twice daily.  Do not flex or bend the affected arm for 24 hours.  No lifting over 5 pounds (2.3 kg) for 5 days after your procedure.  Do not drive home if you are discharged the same day of the procedure. Have someone else drive you.  You may drive 24 hours after the procedure unless otherwise instructed by your caregiver.  What to expect: Any bruising will usually fade within 1 to 2 weeks.  Blood that collects in the tissue (hematoma) may be painful to the touch. It should usually decrease in size and tenderness within 1 to 2 weeks.  SEEK IMMEDIATE MEDICAL CARE IF: You have unusual pain at the radial site.  You have redness, warmth, swelling, or pain at the radial site.  You have drainage (other than a small amount of blood on the dressing).  You have chills.  You have a fever or persistent symptoms for more than 72 hours.  You have a fever and your symptoms suddenly get worse.  Your arm becomes pale, cool, tingly, or numb.  You have heavy bleeding from the site. Hold pressure on the site.   PLEASE DO NOT MISS ANY DOSES OF YOUR BRILINTA!!!!! Also keep a log  of you blood pressures and bring back to your follow up appt. Please call the office with any questions.   Patients taking blood thinners should generally stay away from medicines like ibuprofen, Advil, Motrin, naproxen, and Aleve due to risk of stomach bleeding. You may take Tylenol as directed or talk to your primary doctor about alternatives.  PLEASE ENSURE THAT YOU DO NOT RUN OUT OF YOUR BRILINTA. This medication is very important to remain on for at least one year. IF you have issues obtaining this medication due to cost please CALL the office 3-5 business days prior to running out in order to prevent missing doses of this medication.   Increase activity slowly   Complete by: As directed         Discharge Medications   Allergies as of 04/05/2023       Reactions   Shellfish Allergy    FACIAL EDEMA        Medication List     STOP taking these medications    amLODipine 10 MG tablet Commonly known as: NORVASC       TAKE these medications    Aspirin Low Dose 81 MG chewable tablet Generic drug: aspirin Chew 1 tablet (81 mg total) by mouth daily. Start taking on: April 06, 2023   atorvastatin 80 MG tablet Commonly known as: LIPITOR Take 1 tablet (80 mg total) by mouth daily. Start taking on: April 06, 2023 What changed:  medication strength how much to take when to take this   Brilinta 90 MG Tabs tablet Generic drug: ticagrelor Take 1 tablet (  90 mg total) by mouth 2 (two) times daily.   diazepam 2 MG tablet Commonly known as: VALIUM Take 2 mg by mouth daily as needed for anxiety.   metoprolol tartrate 25 MG tablet Commonly known as: LOPRESSOR Take 0.5 tablets (12.5 mg total) by mouth 2 (two) times daily. What changed:  how much to take when to take this   nicotine 14 mg/24hr patch Commonly known as: NICODERM CQ - dosed in mg/24 hours Place 1 patch (14 mg total) onto the skin daily at 6 (six) AM. Start taking on: April 06, 2023   nitroGLYCERIN 0.4 MG SL  tablet Commonly known as: Nitrostat Place 1 tablet (0.4 mg total) under the tongue every 5 (five) minutes as needed. Notes to patient: Do not take within 36 hrs of nitroglycerin as it may cause a dangerous drop in your blood pressure   tadalafil 5 MG tablet Commonly known as: CIALIS Take 1 tablet (5 mg total) by mouth daily as needed for erectile dysfunction.         Outstanding Labs/Studies   FLP/LFTs in 8 weeks   Duration of Discharge Encounter   Greater than 30 minutes including physician time.  Signed, Laverda Page, NP 04/05/2023, 2:45 PM  I have examined the patient and reviewed assessment and plan and discussed with patient.  Agree with above as stated.    Inferior MI/CAD: s/p RCA stents for occluded RCA on 7/24.  No angina.  Continue aggressive secondary prevention.  Continue dual antiplatelet therapy.  I stressed the importance of staying on Brilinta to avoid stent thrombosis.   HTN: Well-controlled on current medications.  No room for ACE inhibitor or ARB at this time.  Can consider adding as an outpatient.   Hyperlipidemia: High-dose statin.  We discussed whole food, plant-based diet.  Avoid processed sugars.  Avoid processed foods in general.   Tobacco abuse: He needs to stop smoking.  Okay to continue nicotine patch.   PAD: He will had an appointment to see a vascular doctor in July but this was missed.  He is unsure who the doctor was or whether they were actually at [facility].  He does report claudication.  We talked about walking to help improve circulation.  We talked about smoking cessation.  Looks like he has an appointment in a few weeks with Dr. Myra Gianotti based on what is in epic.   Plan on discharge later today if he does well with cardiac rehab.  Lance Muss

## 2023-04-06 NOTE — Telephone Encounter (Signed)
Patient aware of his toc and upcoming appointment with EP

## 2023-04-12 DIAGNOSIS — Z0001 Encounter for general adult medical examination with abnormal findings: Secondary | ICD-10-CM | POA: Diagnosis not present

## 2023-04-12 DIAGNOSIS — F1210 Cannabis abuse, uncomplicated: Secondary | ICD-10-CM | POA: Diagnosis not present

## 2023-04-12 DIAGNOSIS — M545 Low back pain, unspecified: Secondary | ICD-10-CM | POA: Diagnosis not present

## 2023-04-12 DIAGNOSIS — I249 Acute ischemic heart disease, unspecified: Secondary | ICD-10-CM | POA: Diagnosis not present

## 2023-04-12 DIAGNOSIS — I10 Essential (primary) hypertension: Secondary | ICD-10-CM | POA: Diagnosis not present

## 2023-04-13 ENCOUNTER — Other Ambulatory Visit (HOSPITAL_COMMUNITY): Payer: Self-pay

## 2023-04-19 ENCOUNTER — Ambulatory Visit: Payer: Medicare Other | Attending: Nurse Practitioner | Admitting: Nurse Practitioner

## 2023-04-19 ENCOUNTER — Encounter: Payer: Self-pay | Admitting: Nurse Practitioner

## 2023-04-19 VITALS — BP 134/86 | HR 68 | Ht 68.0 in | Wt 177.0 lb

## 2023-04-19 DIAGNOSIS — I739 Peripheral vascular disease, unspecified: Secondary | ICD-10-CM

## 2023-04-19 DIAGNOSIS — I10 Essential (primary) hypertension: Secondary | ICD-10-CM

## 2023-04-19 DIAGNOSIS — I252 Old myocardial infarction: Secondary | ICD-10-CM

## 2023-04-19 DIAGNOSIS — I2510 Atherosclerotic heart disease of native coronary artery without angina pectoris: Secondary | ICD-10-CM

## 2023-04-19 DIAGNOSIS — E785 Hyperlipidemia, unspecified: Secondary | ICD-10-CM

## 2023-04-19 DIAGNOSIS — F1290 Cannabis use, unspecified, uncomplicated: Secondary | ICD-10-CM

## 2023-04-19 MED ORDER — TICAGRELOR 90 MG PO TABS: 90.0000 mg | ORAL_TABLET | Freq: Two times a day (BID) | ORAL | 3 refills | Status: DC

## 2023-04-19 MED ORDER — METOPROLOL TARTRATE 25 MG PO TABS: 12.5000 mg | ORAL_TABLET | Freq: Two times a day (BID) | ORAL | 3 refills | Status: DC

## 2023-04-19 NOTE — Progress Notes (Signed)
Cardiology Office Note:  .   Date:  04/19/2023  ID:  [name], DOB [birth year], MRN [id number] PCP: Lawerance Sabal, PA   HeartCare Providers Cardiologist:  Lance Muss, MD    History of Present Illness: Marland Kitchen   Dale Fowler is a 57 y.o. male with a PMH of CAD, s/p inferior STEMI (03/2023), HTN, HLD, tobacco abuse, peripheral vascular disease, GERD, hx of CVA, idioventricular rhythm, and chronic back pain, who presents today for STEMI/PCI follow-up.   Admitted 03/2023 for inferior STEMI.  Underwent cardiac catheterization that revealed total occlusion of RCA, treated with PCI/DES x 1.  Recommended for DAPT with aspirin/Brilinta for at least 1 year.  Was seen by cardiac rehab.  Echo revealed EF 55 to 60%, hypokinesis of inferior and basal inferior lateral wall, grade 2 DD, normal RV and mild to moderate MR.  Today he presents for [facility] follow-up with his significant other.  He states he is doing well. Denies any chest pain, shortness of breath, palpitations, syncope, presyncope, dizziness, orthopnea, PND, swelling or significant weight changes, acute bleeding, or claudication. Tolerating his medications well and compliant with his medications. Says he will be starting cardiac rehab next week.  SH: He is no longer smoking tobacco. Does occasionally smoke marijuana. Drinks 1/2 can of beer weekly. Denies any other illicit drug use.    Studies Reviewed: .    Echo 03/2023:  1. Left ventricular ejection fraction, by estimation, is 55%. The left  ventricle has normal function. The left ventricle demonstrates regional  wall motion abnormalities with basal inferior and basal inferolateral  severe hypokinesis. Left ventricular  diastolic parameters are consistent with Grade II diastolic dysfunction  (pseudonormalization).   2. Right ventricular systolic function is normal. The right ventricular  size is not well visualized. There is normal pulmonary artery systolic  pressure. The  estimated right ventricular systolic pressure is 21.3 mmHg.   3. The mitral valve is abnormal, there is restriction of the posterior  leaflet that may be related to recent MI with inferior and inferolateral  wall motion abnormalities. Mild to moderate mitral valve regurgitation. No  evidence of mitral stenosis.   4. The aortic valve is tricuspid. There is mild calcification of the  aortic valve. Aortic valve regurgitation is not visualized. No aortic  stenosis is present.   5. The inferior vena cava is normal in size with greater than 50%  respiratory variability, suggesting right atrial pressure of 3 mmHg.  LHC 03/2023:    Prox LAD lesion is 40% stenosed.   Mid LAD lesion is 40% stenosed.   Mid RCA lesion is 99% stenosed.   Prox RCA lesion is 40% stenosed.   1st Mrg lesion is 60% stenosed.   A drug-eluting stent was successfully placed.   Post intervention, there is a 0% residual stenosis.   Post intervention, there is a 0% residual stenosis.   The left ventricular systolic function is normal.   LV end diastolic pressure is mildly elevated.   The left ventricular ejection fraction is 50-55% by visual estimate.   Recommend uninterrupted dual antiplatelet therapy with Aspirin 81mg  daily and Ticagrelor 90mg  twice daily.   Inferior ST segment elevation myocardial infarction secondary to initial total occlusion of a large dominant RCA.   Mild concomitant CAD with 40% ostial LAD followed by 40% proximal stenosis and 20% mid stenosis.  The marginal/ramus like vessel has diffuse 60% mid stenosis.  Mild collateralization to the PDA from septal perforating arteries.   Dominant RCA with  superior takeoff and probable initial total occlusion of the RCA which had opened to 99% following heparinization and visualized on the initial injection.  There also was 40% stenosis proximal to the RV branch.   Reperfusion associated idioventricular rhythm treated with IV Lopressor.   Successful PCI to the RCA  with ultimate insertion of a 3.5 x 20 and 3.5 x 12 mm Synergy stents postdilated to 3.71 mm with the stenoses being reduced to 0% and brisk TIMI-3 flow.   RECOMMENDATION: DAPT with aspirin/Brilinta for minimum of 12 months.  Initiate beta-blocker therapy, and additional anti-ischemic medical therapy for concomitant CAD.  Aggressive lipid-lowering therapy with target LDL less than 55.  Smoking cessation is essential.   Physical Exam:   VS:  BP 134/86 (BP Location: Right Arm, Patient Position: Sitting, Cuff Size: Normal)   Pulse 68   Ht 5\' 8"  (1.727 m)   Wt 177 lb (80.3 kg)   SpO2 100%   BMI 26.91 kg/m    Wt Readings from Last 3 Encounters:  04/19/23 177 lb (80.3 kg)  04/05/23 179 lb 14.3 oz (81.6 kg)  12/28/22 180 lb (81.6 kg)    GEN: Well nourished, well developed in no acute distress NECK: No JVD; No carotid bruits CARDIAC: S1/S2, RRR, no murmurs, rubs, gallops RESPIRATORY:  Clear to auscultation without rales, wheezing or rhonchi  ABDOMEN: Soft, non-tender, non-distended EXTREMITIES:  No edema; No deformity, right radial cath site healing well, no complications  ASSESSMENT AND PLAN: .    CAD, s/p inferior MI and s/p DES to RCA Stable with no anginal symptoms. No indication for ischemic evaluation. Continue Aspirin, Atorvastatin, Lopressor, Brilinta, and NTG PRN. Heart healthy diet and regular cardiovascular exercise encouraged. Okay to start cardiac rehab next week.   2. HTN BP slightly elevated. Discussed SBP goal < 130. BP well controlled at home. Continue Lopressor. Discussed to monitor BP at home at least 2 hours after medications and sitting for 5-10 minutes.Heart healthy diet and regular cardiovascular exercise encouraged.   3. HLD Continue atorvastatin. At next visit, will arrange/obtain FLP and LFT.  Heart healthy diet and regular cardiovascular exercise encouraged.   4. PVD He reported a hx of claudication during past admission. Denies any symptoms today. Missed past  appt in July. Does have upcoming appt and workup with Dr. Myra Gianotti. Continue current medication regimen. He has quit smoking, I have congratulated him.   5. Marijuana use Drug use cessation encouraged and discussed.  Dispo: Follow-up with me or APP in 8 weeks or sooner if anything changes.   Signed, Sharlene Dory, NP

## 2023-04-19 NOTE — Patient Instructions (Signed)
Medication Instructions:  Your physician recommends that you continue on your current medications as directed. Please refer to the Current Medication list given to you today.  *If you need a refill on your cardiac medications before your next appointment, please call your pharmacy*   Lab Work: None If you have labs (blood work) drawn today and your tests are completely normal, you will receive your results only by: MyChart Message (if you have MyChart) OR A paper copy in the mail If you have any lab test that is abnormal or we need to change your treatment, we will call you to review the results.   Testing/Procedures: None   Follow-Up: At [facility], you and your health needs are our priority.  As part of our continuing mission to provide you with exceptional heart care, we have created designated Provider Care Teams.  These Care Teams include your primary Cardiologist (physician) and Advanced Practice Providers (APPs -  Physician Assistants and Nurse Practitioners) who all work together to provide you with the care you need, when you need it.  We recommend signing up for the patient portal called "MyChart".  Sign up information is provided on this After Visit Summary.  MyChart is used to connect with patients for Virtual Visits (Telemedicine).  Patients are able to view lab/test results, encounter notes, upcoming appointments, etc.  Non-urgent messages can be sent to your provider as well.   To learn more about what you can do with MyChart, go to [email or web address].    Your next appointment:   8 week(s)  Provider:   Sharlene Dory, NP  Other Instructions Mediterranean Diet A Mediterranean diet is based on the traditions of countries on the [place] Sea. It focuses on eating more: Fruits and vegetables. Whole grains, beans, nuts, and seeds. Heart-healthy fats. These are fats that are good for your heart. It involves eating less: Dairy. Meat and  eggs. Processed foods with added sugar, salt, and fat. This type of diet can help prevent certain conditions. It can also improve outcomes if you have a long-term (chronic) disease, such as kidney or heart disease. What are tips for following this plan? Reading food labels Check packaged foods for: The serving size. For foods such as rice and pasta, the serving size is the amount of cooked product, not dry. The total fat. Avoid foods with saturated fat or trans fat. Added sugars, such as corn syrup. Shopping  Try to have a balanced diet. Buy a variety of foods, such as: Fresh fruits and vegetables. You may be able to get these from local farmers markets. You can also buy them frozen. Grains, beans, nuts, and seeds. Some of these can be bought in bulk. Fresh seafood. Poultry and eggs. Low-fat dairy products. Buy whole ingredients instead of foods that have already been packaged. If you can't get fresh seafood, buy precooked frozen shrimp or canned fish, such as tuna, salmon, or sardines. Stock your pantry so you always have certain foods on hand, such as olive oil, canned tuna, canned tomatoes, rice, pasta, and beans. Cooking Cook foods with extra-virgin olive oil instead of using butter or other vegetable oils. Have meat as a side dish. Have vegetables or grains as your main dish. This means having meat in small portions or adding small amounts of meat to foods like pasta or stew. Use beans or vegetables instead of meat in common dishes like chili or lasagna. Try out different cooking methods. Try roasting, broiling, steaming, and sauting vegetables. Add frozen  vegetables to soups, stews, pasta, or rice. Add nuts or seeds for added healthy fats and plant protein at each meal. You can add these to yogurt, salads, or vegetable dishes. Marinate fish or vegetables using olive oil, lemon juice, garlic, and fresh herbs. Meal planning Plan to eat a vegetarian meal one day each week. Try to  work up to two vegetarian meals, if possible. Eat seafood two or more times a week. Have healthy snacks on hand. These may include: Vegetable sticks with hummus. Greek yogurt. Fruit and nut trail mix. Eat balanced meals. These should include: Fruit: 2-3 servings a day. Vegetables: 4-5 servings a day. Low-fat dairy: 2 servings a day. Fish, poultry, or lean meat: 1 serving a day. Beans and legumes: 2 or more servings a week. Nuts and seeds: 1-2 servings a day. Whole grains: 6-8 servings a day. Extra-virgin olive oil: 3-4 servings a day. Limit red meat and sweets to just a few servings a month. Lifestyle  Try to cook and eat meals with your family. Drink enough fluid to keep your pee (urine) pale yellow. Be active every day. This includes: Aerobic exercise, which is exercise that causes your heart to beat faster. Examples include running and swimming. Leisure activities like gardening, walking, or housework. Get 7-8 hours of sleep each night. Drink red wine if your provider says you can. A glass of wine is 5 oz (150 mL). You may be allowed to have: Up to 1 glass a day if you're male and not pregnant. Up to 2 glasses a day if you're male. What foods should I eat? Fruits Apples. Apricots. Avocado. Berries. Bananas. Cherries. Dates. Figs. Grapes. Lemons. Melon. Oranges. Peaches. Plums. Pomegranate. Vegetables Artichokes. Beets. Broccoli. Cabbage. Carrots. Eggplant. Green beans. Chard. Kale. Spinach. Onions. Leeks. Peas. Squash. Tomatoes. Peppers. Radishes. Grains Whole-grain pasta. Brown rice. Bulgur wheat. Polenta. Couscous. Whole-wheat bread. Orpah Cobb. Meats and other proteins Beans. Almonds. Sunflower seeds. Pine nuts. Peanuts. Cod. Salmon. Scallops. Shrimp. Tuna. Tilapia. Clams. Oysters. Eggs. Chicken or [place] without skin. Dairy Low-fat milk. Cheese. Greek yogurt. Fats and oils Extra-virgin olive oil. Avocado oil. Grapeseed oil. Beverages Water. Red wine. Herbal  tea. Sweets and desserts Greek yogurt with honey. Baked apples. Poached pears. Trail mix. Seasonings and condiments Basil. Cilantro. Coriander. Cumin. Mint. Parsley. Sage. Rosemary. Tarragon. Garlic. Oregano. Thyme. Pepper. Balsamic vinegar. Tahini. Hummus. Tomato sauce. Olives. Mushrooms. The items listed above may not be all the foods and drinks you can have. Talk to a dietitian to learn more. What foods should I limit? This is a list of foods that should be eaten rarely. Fruits Fruit canned in syrup. Vegetables Deep-fried potatoes, like [place] fries. Grains Packaged pasta or rice dishes. Cereal with added sugar. Snacks with added sugar. Meats and other proteins Beef. Pork. Lamb. Chicken or [place] with skin. Hot dogs. Tomasa Blase. Dairy Ice cream. Sour cream. Whole milk. Fats and oils Butter. Canola oil. Vegetable oil. Beef fat (tallow). Lard. Beverages Juice. Sugar-sweetened soft drinks. Beer. Liquor and spirits. Sweets and desserts Cookies. Cakes. Pies. Candy. Seasonings and condiments Mayonnaise. Pre-made sauces and marinades. The items listed above may not be all the foods and drinks you should limit. Talk to a dietitian to learn more. Where to find more information American Heart Association (AHA): heart.org This information is not intended to replace advice given to you by your health care provider. Make sure you discuss any questions you have with your health care provider. Document Revised: 12/10/2022 Document Reviewed: 12/10/2022 Elsevier Patient Education  2024  [employer].

## 2023-04-23 ENCOUNTER — Encounter: Payer: Self-pay | Admitting: Surgery

## 2023-04-23 ENCOUNTER — Ambulatory Visit (HOSPITAL_COMMUNITY)
Admission: RE | Admit: 2023-04-23 | Discharge: 2023-04-23 | Disposition: A | Discharge status: Home / Self Care | Payer: Medicare Other | Source: Ambulatory Visit | Attending: Surgery | Admitting: Surgery

## 2023-04-23 ENCOUNTER — Ambulatory Visit (INDEPENDENT_AMBULATORY_CARE_PROVIDER_SITE_OTHER)
Admission: RE | Admit: 2023-04-23 | Discharge: 2023-04-23 | Disposition: A | Discharge status: Home / Self Care | Payer: Medicare Other | Source: Ambulatory Visit | Attending: Surgery | Admitting: Surgery

## 2023-04-23 ENCOUNTER — Ambulatory Visit (INDEPENDENT_AMBULATORY_CARE_PROVIDER_SITE_OTHER): Payer: Medicare Other | Admitting: Surgery

## 2023-04-23 VITALS — BP 157/95 | HR 61 | Temp 97.9°F | Wt 178.0 lb

## 2023-04-23 DIAGNOSIS — I70213 Atherosclerosis of native arteries of extremities with intermittent claudication, bilateral legs: Secondary | ICD-10-CM | POA: Diagnosis not present

## 2023-04-23 DIAGNOSIS — I771 Stricture of artery: Secondary | ICD-10-CM

## 2023-04-23 DIAGNOSIS — N2889 Other specified disorders of kidney and ureter: Secondary | ICD-10-CM | POA: Diagnosis not present

## 2023-04-23 DIAGNOSIS — I6523 Occlusion and stenosis of bilateral carotid arteries: Secondary | ICD-10-CM | POA: Diagnosis not present

## 2023-04-23 LAB — VAS US ABI WITH/WO TBI
Left ABI: 0.71
Right ABI: 1.06

## 2023-04-23 NOTE — Progress Notes (Signed)
[facility]  Patient name: Dale Fowler MRN: [id number] DOB: 23-May-1966 Sex: male   REQUESTING PROVIDER:    Dr. Annabell Howells   REASON FOR CONSULT:    Claudication  and renovascular disease  HISTORY OF PRESENT ILLNESS:   Dale Fowler is a 57 y.o. male, who is referred for evaluation of renovascular hypertension as well as claudication.  The patient recently underwent CT scan imaging which revealed calcific disease around his renal arteries and iliac vessels.  He recently had a heart attack over the summer.  He does have a history of TIA several years ago with right arm weakness and facial drooping and slurred speech.  He reports that when mowing the yard, he will get cramping in his left calf.  This does resolve with rest.  He does not have any open wounds or rest pain.  He takes a statin for hypercholesterolemia.  He is on dual antiplatelet therapy.  He is medically managed for hypertension.  PAST MEDICAL HISTORY    Past Medical History:  Diagnosis Date   GERD (gastroesophageal reflux disease)    Hyperlipidemia    Hypertension    does not see a cardiologist   TIA (transient ischemic attack)    X2     FAMILY HISTORY   Family History  Problem Relation Age of Onset   Hypertension Mother    Diabetes Mother     SOCIAL HISTORY:   Social History   Socioeconomic History   Marital status: Divorced    Spouse name: Not on file   Number of children: Not on file   Years of education: Not on file   Highest education level: Not on file  Occupational History   Not on file  Tobacco Use   Smoking status: Former    Current packs/day: 0.00    Average packs/day: 0.5 packs/day for 18.0 years (9.0 ttl pk-yrs)    Types: Cigarettes    Quit date: 2024    Years since quitting: 0.6   Smokeless tobacco: Not on file  Vaping Use   Vaping status: Never Used  Substance and Sexual Activity   Alcohol use: Yes   Drug use:  Yes    Types: Marijuana   Sexual activity: Not on file  Other Topics Concern   Not on file  Social History Narrative   Not on file   Social Determinants of Health   Financial Resource Strain: Not on file  Food Insecurity: No Food Insecurity (04/04/2023)   Hunger Vital Sign    Worried About Running Out of Food in the Last Year: Never true    Ran Out of Food in the Last Year: Never true  Transportation Needs: No Transportation Needs (04/04/2023)   PRAPARE - Administrator, Civil Service (Medical): No    Lack of Transportation (Non-Medical): No  Physical Activity: Not on file  Stress: Not on file  Social Connections: Not on file  Intimate Partner Violence: Patient Unable To Answer (04/05/2023)   Humiliation, Afraid, Rape, and Kick questionnaire    Fear of Current or Ex-Partner: Patient unable to answer    Emotionally Abused: Patient unable to answer    Physically Abused: Patient unable to answer    Sexually Abused: Patient unable to answer    ALLERGIES:    Allergies  Allergen Reactions   Shellfish Allergy     FACIAL EDEMA    CURRENT MEDICATIONS:    Current Outpatient Medications  Medication Sig Dispense Refill  aspirin 81 MG chewable tablet Chew 1 tablet (81 mg total) by mouth daily. 90 tablet 2   atorvastatin (LIPITOR) 80 MG tablet Take 1 tablet (80 mg total) by mouth daily. 90 tablet 1   diazepam (VALIUM) 2 MG tablet Take 2 mg by mouth daily as needed for anxiety.     metoprolol tartrate (LOPRESSOR) 25 MG tablet Take 0.5 tablets (12.5 mg total) by mouth 2 (two) times daily. 180 tablet 3   nicotine (NICODERM CQ - DOSED IN MG/24 HOURS) 14 mg/24hr patch Place 1 patch (14 mg total) onto the skin daily at 6 (six) AM. 28 patch 0   nitroGLYCERIN (NITROSTAT) 0.4 MG SL tablet Place 1 tablet (0.4 mg total) under the tongue every 5 (five) minutes as needed. 25 tablet 2   tadalafil (CIALIS) 5 MG tablet Take 1 tablet (5 mg total) by mouth daily as needed for erectile  dysfunction. 30 tablet 11   ticagrelor (BRILINTA) 90 MG TABS tablet Take 1 tablet (90 mg total) by mouth 2 (two) times daily. 180 tablet 3   No current facility-administered medications for this visit.    REVIEW OF SYSTEMS:   [X]  denotes positive finding, [ ]  denotes negative finding Cardiac  Comments:  Chest pain or chest pressure:    Shortness of breath upon exertion:    Short of breath when lying flat:    Irregular heart rhythm:        Vascular    Pain in calf, thigh, or hip brought on by ambulation: x   Pain in feet at night that wakes you up from your sleep:     Blood clot in your veins:    Leg swelling:         Pulmonary    Oxygen at home:    Productive cough:     Wheezing:         Neurologic    Sudden weakness in arms or legs:     Sudden numbness in arms or legs:     Sudden onset of difficulty speaking or slurred speech:    Temporary loss of vision in one eye:     Problems with dizziness:         Gastrointestinal    Blood in stool:      Vomited blood:         Genitourinary    Burning when urinating:     Blood in urine:        Psychiatric    Major depression:         Hematologic    Bleeding problems:    Problems with blood clotting too easily:        Skin    Rashes or ulcers:        Constitutional    Fever or chills:     PHYSICAL EXAM:   Vitals:   04/23/23 1002  BP: (!) 157/95  Pulse: 61  Temp: 97.9 F (36.6 C)  TempSrc: Temporal  SpO2: 98%  Weight: 178 lb (80.7 kg)    GENERAL: The patient is a well-nourished male, in no acute distress. The vital signs are documented above. CARDIAC: There is a regular rate and rhythm.  VASCULAR: Nonpalpable pedal pulses.  No carotid bruits. PULMONARY: Nonlabored respirations ABDOMEN: Soft and non-tender  MUSCULOSKELETAL: There are no major deformities or cyanosis. NEUROLOGIC: No focal weakness or paresthesias are detected. SKIN: There are no ulcers or rashes noted. PSYCHIATRIC: The patient has a normal  affect.  STUDIES:   I  have reviewed the following: ABI/TBIToday's ABIToday's TBIPrevious ABIPrevious TBI  +-------+-----------+-----------+------------+------------+  Right 1.06       0.47                                 +-------+-----------+-----------+------------+------------+  Left  0.71       0.43                                 +-------+-----------+-----------+------------+------------+  Right toe pressure: 66, multiphasic waveforms Left toe pressure: 61, monophasic waveforms  Renal:    Right: No evidence of right renal artery stenosis. Normal right         Resisitive Index. RRV flow present.  Left:  No evidence of left renal artery stenosis. Normal left         Resistive Index. LRV flow present.    ASSESSMENT and PLAN   Claudication: The main symptoms are on the left leg.  This has been longstanding for several years.  It is worse with activity and resolves with rest.  His main symptoms are on the left side.  I discussed the importance of a exercise program.  We discussed the details of this.  He is scheduled to start cardiac rehab in the near future.  I also discussed the importance of daily monitoring of his feet for nonhealing wounds.  I stressed the importance of smoking cessation.  We discussed starting cilostazol, however he would prefer not to start this at this time.  He will follow-up in 6 months for follow-up evaluation of his symptoms  Carotid: The patient has had 2 TIAs in the past.  I do not see where he has had a carotid ultrasound.  I will get this when he returns.   Charlena Cross, MD, FACS [facility] [phone] Pager [phone]

## 2023-04-24 ENCOUNTER — Encounter (HOSPITAL_COMMUNITY)
Admission: RE | Admit: 2023-04-24 | Discharge: 2023-04-24 | Disposition: A | Discharge status: Home / Self Care | Payer: Medicare Other | Source: Ambulatory Visit | Attending: Interventional Cardiology | Admitting: Interventional Cardiology

## 2023-04-24 ENCOUNTER — Encounter (HOSPITAL_COMMUNITY): Payer: Self-pay

## 2023-04-24 ENCOUNTER — Encounter (HOSPITAL_COMMUNITY): Payer: Medicare Other

## 2023-04-24 DIAGNOSIS — I2111 ST elevation (STEMI) myocardial infarction involving right coronary artery: Secondary | ICD-10-CM | POA: Insufficient documentation

## 2023-04-24 DIAGNOSIS — Z955 Presence of coronary angioplasty implant and graft: Secondary | ICD-10-CM | POA: Insufficient documentation

## 2023-04-24 NOTE — Progress Notes (Signed)
Completed virtual orientation today.  EP evaluation is scheduled for 04/25/23 at 1230.  Documentation for diagnosis can be found in [facility] encounter 04/04/23 and OV 04/19/23.  Rihan has recently quit tobacco use within the last 6 months. Intervention for relapse prevention was provided at the initial medical review. He was encouraged to continue to with tobacco cessation and was provided information on relapse prevention. Patient received information about combination therapy, tobacco cessation classes, quit line, and quit smoking apps in case of a relapse. Patient demonstrated understanding of this material.Staff will continue to provide encouragement and follow up with the patient throughout the program.

## 2023-04-25 ENCOUNTER — Encounter (HOSPITAL_COMMUNITY)
Admission: RE | Admit: 2023-04-25 | Discharge: 2023-04-25 | Disposition: A | Discharge status: Home / Self Care | Payer: Medicare Other | Source: Ambulatory Visit | Attending: Cardiovascular Disease | Admitting: Cardiovascular Disease

## 2023-04-25 VITALS — Ht 67.0 in | Wt 175.1 lb

## 2023-04-25 DIAGNOSIS — I2111 ST elevation (STEMI) myocardial infarction involving right coronary artery: Secondary | ICD-10-CM | POA: Diagnosis not present

## 2023-04-25 DIAGNOSIS — Z955 Presence of coronary angioplasty implant and graft: Secondary | ICD-10-CM

## 2023-04-25 NOTE — Progress Notes (Signed)
Cardiac Individual Treatment Plan  Patient Details  Name: Dale Fowler MRN: [id number] Date of Birth: May 03, 1966 Referring Provider:   Flowsheet Row CARDIAC REHAB PHASE II ORIENTATION from 04/25/2023 in [facility] CARDIAC REHABILITATION  Referring Provider Aleen Campi MD       Initial Encounter Date:  Flowsheet Row CARDIAC REHAB PHASE II ORIENTATION from 04/25/2023 in [facility] CARDIAC REHABILITATION  Date 04/25/23       Visit Diagnosis: ST elevation myocardial infarction involving right coronary artery [facility])  Status post coronary artery stent placement  Patient's Home Medications on Admission:  Current Outpatient Medications:    aspirin 81 MG chewable tablet, Chew 1 tablet (81 mg total) by mouth daily., Disp: 90 tablet, Rfl: 2   atorvastatin (LIPITOR) 80 MG tablet, Take 1 tablet (80 mg total) by mouth daily., Disp: 90 tablet, Rfl: 1   diazepam (VALIUM) 2 MG tablet, Take 2 mg by mouth daily as needed for anxiety., Disp: , Rfl:    metoprolol tartrate (LOPRESSOR) 25 MG tablet, Take 0.5 tablets (12.5 mg total) by mouth 2 (two) times daily., Disp: 180 tablet, Rfl: 3   nicotine (NICODERM CQ - DOSED IN MG/24 HOURS) 14 mg/24hr patch, Place 1 patch (14 mg total) onto the skin daily at 6 (six) AM., Disp: 28 patch, Rfl: 0   nitroGLYCERIN (NITROSTAT) 0.4 MG SL tablet, Place 1 tablet (0.4 mg total) under the tongue every 5 (five) minutes as needed., Disp: 25 tablet, Rfl: 2   tadalafil (CIALIS) 5 MG tablet, Take 1 tablet (5 mg total) by mouth daily as needed for erectile dysfunction., Disp: 30 tablet, Rfl: 11   ticagrelor (BRILINTA) 90 MG TABS tablet, Take 1 tablet (90 mg total) by mouth 2 (two) times daily., Disp: 180 tablet, Rfl: 3  Past Medical History: Past Medical History:  Diagnosis Date   GERD (gastroesophageal reflux disease)    Hyperlipidemia    Hypertension    does not see a cardiologist   TIA (transient ischemic attack)    X2    Tobacco Use: Social History    Tobacco Use  Smoking Status Former   Current packs/day: 0.00   Average packs/day: 0.5 packs/day for 18.0 years (9.0 ttl pk-yrs)   Types: Cigarettes   Quit date: 03/2023   Years since quitting: 0.1  Smokeless Tobacco Not on file    Labs: Review Flowsheet       Latest Ref Rng & Units 04/04/2023  Labs for ITP Cardiac and Pulmonary Rehab  Cholestrol 0 - 200 mg/dL 440   LDL (calc) 0 - 99 mg/dL 102   HDL-C >72 mg/dL 38   Trlycerides <536 mg/dL 65   Hemoglobin U4Q 4.8 - 5.6 % 5.7   TCO2 22 - 32 mmol/L 25     Details            Capillary Blood Glucose: No results found for: "GLUCAP"   Exercise Target Goals: Exercise Program Goal: Individual exercise prescription set using results from initial 6 min walk test and THRR while considering  patient's activity barriers and safety.   Exercise Prescription Goal: Starting with aerobic activity 30 plus minutes a day, 3 days per week for initial exercise prescription. Provide home exercise prescription and guidelines that participant acknowledges understanding prior to discharge.  Activity Barriers & Risk Stratification:  Activity Barriers & Cardiac Risk Stratification - 04/24/23 1520       Activity Barriers & Cardiac Risk Stratification   Activity Barriers Deconditioning;Muscular Weakness;Other (comment);Back Problems;Joint Problems;Shortness of Breath  Comments Left leg claudication, back pain from previous surgeries, TIA x2 residuals on right side (droop/speech)    Cardiac Risk Stratification High             6 Minute Walk:  6 Minute Walk     Row Name 04/25/23 1410         6 Minute Walk   Phase Initial     Distance 1300 feet     Walk Time 6 minutes     # of Rest Breaks 0     MPH 2.46     METS 3.76     RPE 15     VO2 Peak 13.16     Symptoms Yes (comment)     Comments claudication pain 4/5     Resting HR 71 bpm     Resting BP 126/72     Resting Oxygen Saturation  96 %     Exercise Oxygen Saturation   during 6 min walk 98 %     Max Ex. HR 107 bpm     Max Ex. BP 134/78     2 Minute Post BP 124/60              Oxygen Initial Assessment:   Oxygen Re-Evaluation:   Oxygen Discharge (Final Oxygen Re-Evaluation):   Initial Exercise Prescription:  Initial Exercise Prescription - 04/25/23 1400       Date of Initial Exercise RX and Referring Provider   Date 04/25/23    Referring Provider Aleen Campi MD      Oxygen   Maintain Oxygen Saturation 88% or higher      Treadmill   MPH 2.4    Grade 0    Minutes 15    METs 2.84      REL-XR   Level 3    Speed 50    Minutes 15    METs 2.5      Prescription Details   Frequency (times per week) 2    Duration Progress to 30 minutes of continuous aerobic without signs/symptoms of physical distress      Intensity   THRR 40-80% of Max Heartrate 108-145    Ratings of Perceived Exertion 11-13    Perceived Dyspnea 0-4      Progression   Progression Continue to progress workloads to maintain intensity without signs/symptoms of physical distress.      Resistance Training   Training Prescription Yes    Weight 4 lb    Reps 10-15             Perform Capillary Blood Glucose checks as needed.  Exercise Prescription Changes:   Exercise Prescription Changes     Row Name 04/25/23 1400             Response to Exercise   Blood Pressure (Admit) 126/72       Blood Pressure (Exercise) 134/78       Blood Pressure (Exit) 124/60       Heart Rate (Admit) 71 bpm       Heart Rate (Exercise) 107 bpm       Heart Rate (Exit) 63 bpm       Oxygen Saturation (Admit) 96 %       Oxygen Saturation (Exercise) 98 %       Rating of Perceived Exertion (Exercise) 15       Symptoms claudication pain 4/5       Comments walk test results  Exercise Comments:   Exercise Goals and Review:   Exercise Goals     Row Name 04/25/23 1412             Exercise Goals   Increase Physical Activity Yes        Intervention Provide advice, education, support and counseling about physical activity/exercise needs.;Develop an individualized exercise prescription for aerobic and resistive training based on initial evaluation findings, risk stratification, comorbidities and participant's personal goals.       Expected Outcomes Short Term: Attend rehab on a regular basis to increase amount of physical activity.;Long Term: Add in home exercise to make exercise part of routine and to increase amount of physical activity.;Long Term: Exercising regularly at least 3-5 days a week.       Increase Strength and Stamina Yes       Intervention Provide advice, education, support and counseling about physical activity/exercise needs.;Develop an individualized exercise prescription for aerobic and resistive training based on initial evaluation findings, risk stratification, comorbidities and participant's personal goals.       Expected Outcomes Short Term: Increase workloads from initial exercise prescription for resistance, speed, and METs.;Short Term: Perform resistance training exercises routinely during rehab and add in resistance training at home;Long Term: Improve cardiorespiratory fitness, muscular endurance and strength as measured by increased METs and functional capacity ( )       Able to understand and use rate of perceived exertion (RPE) scale Yes       Intervention Provide education and explanation on how to use RPE scale       Expected Outcomes Short Term: Able to use RPE daily in rehab to express subjective intensity level;Long Term:  Able to use RPE to guide intensity level when exercising independently       Able to understand and use Dyspnea scale Yes       Intervention Provide education and explanation on how to use Dyspnea scale       Expected Outcomes Short Term: Able to use Dyspnea scale daily in rehab to express subjective sense of shortness of breath during exertion;Long Term: Able to use Dyspnea scale to  guide intensity level when exercising independently       Knowledge and understanding of Target Heart Rate Range (THRR) Yes       Intervention Provide education and explanation of THRR including how the numbers were predicted and where they are located for reference       Expected Outcomes Short Term: Able to state/look up THRR;Long Term: Able to use THRR to govern intensity when exercising independently;Short Term: Able to use daily as guideline for intensity in rehab       Able to check pulse independently Yes       Intervention Provide education and demonstration on how to check pulse in carotid and radial arteries.;Review the importance of being able to check your own pulse for safety during independent exercise       Expected Outcomes Short Term: Able to explain why pulse checking is important during independent exercise;Long Term: Able to check pulse independently and accurately       Understanding of Exercise Prescription Yes       Intervention Provide education, explanation, and written materials on patient's individual exercise prescription       Expected Outcomes Short Term: Able to explain program exercise prescription;Long Term: Able to explain home exercise prescription to exercise independently       Improve claudication pain toleration; Improve walking ability Yes  Intervention Attend education sessions to aid in risk factor modification and understanding of disease process       Expected Outcomes Short Term: Improve walking distance/time to onset of claudication pain;Long Term: Improve walking ability and toleration to claudication                Exercise Goals Re-Evaluation :  Exercise Goals Re-Evaluation     Row Name 04/25/23 1413             Exercise Goal Re-Evaluation   Exercise Goals Review Able to understand and use rate of perceived exertion (RPE) scale;Able to understand and use Dyspnea scale       Comments Reviewed RPE  and dyspnea scale, and program  prescription with pt today.  Pt voiced understanding and was given a copy of goals to take home.       Expected Outcomes Short: Use RPE daily to regulate intensity.  Long: Follow program prescription                 Discharge Exercise Prescription (Final Exercise Prescription Changes):  Exercise Prescription Changes - 04/25/23 1400       Response to Exercise   Blood Pressure (Admit) 126/72    Blood Pressure (Exercise) 134/78    Blood Pressure (Exit) 124/60    Heart Rate (Admit) 71 bpm    Heart Rate (Exercise) 107 bpm    Heart Rate (Exit) 63 bpm    Oxygen Saturation (Admit) 96 %    Oxygen Saturation (Exercise) 98 %    Rating of Perceived Exertion (Exercise) 15    Symptoms claudication pain 4/5    Comments walk test results             Nutrition:  Target Goals: Understanding of nutrition guidelines, daily intake of sodium 1500mg , cholesterol 200mg , calories 30% from fat and 7% or less from saturated fats, daily to have 5 or more servings of fruits and vegetables.  Biometrics:  Pre Biometrics - 04/25/23 1414       Pre Biometrics   Height 5\' 7"  (1.702 m)    Weight 79.4 kg    Waist Circumference 34 inches    Hip Circumference 36 inches    Waist to Hip Ratio 0.94 %    BMI (Calculated) 27.42    Grip Strength 35.8 kg    Single Leg Stand 27.6 seconds              Nutrition Therapy Plan and Nutrition Goals:  Nutrition Therapy & Goals - 04/24/23 1547       Intervention Plan   Intervention Prescribe, educate and counsel regarding individualized specific dietary modifications aiming towards targeted core components such as weight, hypertension, lipid management, diabetes, heart failure and other comorbidities.;Nutrition handout(s) given to patient.    Expected Outcomes Short Term Goal: Understand basic principles of dietary content, such as calories, fat, sodium, cholesterol and nutrients.;Short Term Goal: A plan has been developed with personal nutrition goals set  during dietitian appointment.;Long Term Goal: Adherence to prescribed nutrition plan.             Nutrition Assessments:  MEDIFICTS Score Key: ?70 Need to make dietary changes  40-70 Heart Healthy Diet ? 40 Therapeutic Level Cholesterol Diet  Flowsheet Row CARDIAC REHAB PHASE II ORIENTATION from 04/25/2023 in [facility] CARDIAC REHABILITATION  Picture Your Plate Total Score on Admission 58 (P)       Picture Your Plate Scores: <16 Unhealthy dietary pattern with much room for improvement. 41-50  Dietary pattern unlikely to meet recommendations for good health and room for improvement. 51-60 More healthful dietary pattern, with some room for improvement.  >60 Healthy dietary pattern, although there may be some specific behaviors that could be improved.    Nutrition Goals Re-Evaluation:   Nutrition Goals Discharge (Final Nutrition Goals Re-Evaluation):   Psychosocial: Target Goals: Acknowledge presence or absence of significant depression and/or stress, maximize coping skills, provide positive support system. Participant is able to verbalize types and ability to use techniques and skills needed for reducing stress and depression.  Initial Review & Psychosocial Screening:  Initial Psych Review & Screening - 04/24/23 1523       Initial Review   Current issues with History of Depression;Current Anxiety/Panic;Current Depression;Current Stress Concerns;Current Sleep Concerns    Source of Stress Concerns Chronic Illness;Family    Comments not a good sleeping recently has been more restless at night, depression worse since heart event, several deaths in the family      Family Dynamics   Good Support System? Yes   girlfriend and kids live with them 14, 20, 25     Barriers   Psychosocial barriers to participate in program The patient should benefit from training in stress management and relaxation.;Psychosocial barriers identified (see note)      Screening Interventions    Interventions Encouraged to exercise;Provide feedback about the scores to participant;To provide support and resources with identified psychosocial needs    Expected Outcomes Short Term goal: Utilizing psychosocial counselor, staff and physician to assist with identification of specific Stressors or current issues interfering with healing process. Setting desired goal for each stressor or current issue identified.;Long Term Goal: Stressors or current issues are controlled or eliminated.;Short Term goal: Identification and review with participant of any Quality of Life or Depression concerns found by scoring the questionnaire.;Long Term goal: The participant improves quality of Life and PHQ9 Scores as seen by post scores and/or verbalization of changes             Quality of Life Scores:  Quality of Life - 04/25/23 1414       Quality of Life   Select Quality of Life      Quality of Life Scores   Health/Function Pre 22.39 %    Socioeconomic Pre 25 %    Psych/Spiritual Pre 30 %    Family Pre 30 %    GLOBAL Pre 25.71 %            Scores of 19 and below usually indicate a poorer quality of life in these areas.  A difference of  2-3 points is a clinically meaningful difference.  A difference of 2-3 points in the total score of the Quality of Life Index has been associated with significant improvement in overall quality of life, self-image, physical symptoms, and general health in studies assessing change in quality of life.  PHQ-9: Review Flowsheet       04/25/2023  Depression screen PHQ 2/9  Decreased Interest 3  Down, Depressed, Hopeless 1  PHQ - 2 Score 4  Altered sleeping 1  Tired, decreased energy 3  Change in appetite 3  Feeling bad or failure about yourself  2  Trouble concentrating 3  Moving slowly or fidgety/restless 1  Suicidal thoughts 0  PHQ-9 Score 17  Difficult doing work/chores Somewhat difficult    Details           Interpretation of Total Score  Total  Score Depression Severity:  1-4 = Minimal depression,  5-9 = Mild depression, 10-14 = Moderate depression, 15-19 = Moderately severe depression, 20-27 = Severe depression   Psychosocial Evaluation and Intervention:  Psychosocial Evaluation - 04/24/23 1541       Psychosocial Evaluation & Interventions   Comments Raney is coming into rehab after STEMI and stent.  He is looking forward to rehab to build back his stamina and getting back to his normal.  He and his girlfriend want to get his confidence back and get him back to himself again.  He does have a history of TIA x2 which has left him with some residual weakness on right side and slurred speech with some facial droop, but he does not let this slow him down.  He also has a history of depression, but it seems to have worsened since his heart even along with more anxiety.  They also mentioned that there have been several deaths in the family recently, which has him grieving more.  He lives with his girlfriend and their 3 kids who are all very supportive.  They noted that he has been a lot more restless since his heart event and not sleeping as well.  We talked about how exercise could help and if it doesn't then talking to the doctor about it as they have not brought it up yet.  They were both open to this.  We encouraged his girlfriend to come tomorrow for orientation and to attend education classes as well if she wanted.  His barriers to rehab are his chronic back pain and claudication pain which both limit his activity.  We talked through how we would work around these during rehab.  He quit smoking after his heart attack and was commended on his success.  So far he is doing well with it, but this could be adding to some of his anxiety too.    Expected Outcomes Short: Attend rehab to build stamina and confidence Long: Continue to work through grieving process    Continue Psychosocial Services  Follow up required by staff              Psychosocial Re-Evaluation:   Psychosocial Discharge (Final Psychosocial Re-Evaluation):   Vocational Rehabilitation: Provide vocational rehab assistance to qualifying candidates.   Vocational Rehab Evaluation & Intervention:  Vocational Rehab - 04/24/23 1522       Initial Vocational Rehab Evaluation & Intervention   Assessment shows need for Vocational Rehabilitation No   disabled            Education: Education Goals: Education classes will be provided on a weekly basis, covering required topics. Participant will state understanding/return demonstration of topics presented.  Learning Barriers/Preferences:  Learning Barriers/Preferences - 04/24/23 1518       Learning Barriers/Preferences   Learning Barriers Sight   glasses for reading   Learning Preferences Skilled Demonstration             Education Topics: Hypertension, Hypertension Reduction -Define heart disease and high blood pressure. Discus how high blood pressure affects the body and ways to reduce high blood pressure.   Exercise and Your Heart -Discuss why it is important to exercise, the FITT principles of exercise, normal and abnormal responses to exercise, and how to exercise safely.   Angina -Discuss definition of angina, causes of angina, treatment of angina, and how to decrease risk of having angina.   Cardiac Medications -Review what the following cardiac medications are used for, how they affect the body, and side effects that may occur when  taking the medications.  Medications include Aspirin, Beta blockers, calcium channel blockers, ACE Inhibitors, angiotensin receptor blockers, diuretics, digoxin, and antihyperlipidemics.   Congestive Heart Failure -Discuss the definition of CHF, how to live with CHF, the signs and symptoms of CHF, and how keep track of weight and sodium intake.   Heart Disease and Intimacy -Discus the effect sexual activity has on the heart, how changes occur  during intimacy as we age, and safety during sexual activity.   Smoking Cessation / COPD -Discuss different methods to quit smoking, the health benefits of quitting smoking, and the definition of COPD.   Nutrition I: Fats -Discuss the types of cholesterol, what cholesterol does to the heart, and how cholesterol levels can be controlled.   Nutrition II: Labels -Discuss the different components of food labels and how to read food label   Heart Parts/Heart Disease and PAD -Discuss the anatomy of the heart, the pathway of blood circulation through the heart, and these are affected by heart disease.   Stress I: Signs and Symptoms -Discuss the causes of stress, how stress may lead to anxiety and depression, and ways to limit stress.   Stress II: Relaxation -Discuss different types of relaxation techniques to limit stress.   Warning Signs of Stroke / TIA -Discuss definition of a stroke, what the signs and symptoms are of a stroke, and how to identify when someone is having stroke.   Knowledge Questionnaire Score:  Knowledge Questionnaire Score - 04/25/23 1417       Knowledge Questionnaire Score   Pre Score 26/28             Core Components/Risk Factors/Patient Goals at Admission:  Personal Goals and Risk Factors at Admission - 04/25/23 1417       Core Components/Risk Factors/Patient Goals on Admission    Weight Management Yes;Weight Loss;Weight Maintenance    Intervention Weight Management: Develop a combined nutrition and exercise program designed to reach desired caloric intake, while maintaining appropriate intake of nutrient and fiber, sodium and fats, and appropriate energy expenditure required for the weight goal.;Weight Management: Provide education and appropriate resources to help participant work on and attain dietary goals.    Admit Weight 175 lb 1.6 oz (79.4 kg)    Goal Weight: Short Term 170 lb (77.1 kg)    Goal Weight: Long Term 170 lb (77.1 kg)     Expected Outcomes Short Term: Continue to assess and modify interventions until short term weight is achieved;Long Term: Adherence to nutrition and physical activity/exercise program aimed toward attainment of established weight goal;Weight Maintenance: Understanding of the daily nutrition guidelines, which includes 25-35% calories from fat, 7% or less cal from saturated fats, less than 200mg  cholesterol, less than 1.5gm of sodium, & 5 or more servings of fruits and vegetables daily;Weight Loss: Understanding of general recommendations for a balanced deficit meal plan, which promotes 1-2 lb weight loss per week and includes a negative energy balance of [phone] kcal/d;Understanding recommendations for meals to include 15-35% energy as protein, 25-35% energy from fat, 35-60% energy from carbohydrates, less than 200mg  of dietary cholesterol, 20-35 gm of total fiber daily;Understanding of distribution of calorie intake throughout the day with the consumption of 4-5 meals/snacks    Tobacco Cessation Yes   quit 03/2023   Intervention Offer self-teaching materials, assist with locating and accessing local/national Quit Smoking programs, and support quit date choice.    Expected Outcomes Short Term: Will quit all tobacco product use, adhering to prevention of relapse plan.;Long Term: Complete  abstinence from all tobacco products for at least 12 months from quit date.    Hypertension Yes    Intervention Provide education on lifestyle modifcations including regular physical activity/exercise, weight management, moderate sodium restriction and increased consumption of fresh fruit, vegetables, and low fat dairy, alcohol moderation, and smoking cessation.;Monitor prescription use compliance.    Expected Outcomes Short Term: Continued assessment and intervention until BP is < 140/45mm HG in hypertensive participants. < 130/58mm HG in hypertensive participants with diabetes, heart failure or chronic kidney disease.;Long  Term: Maintenance of blood pressure at goal levels.    Lipids Yes    Intervention Provide education and support for participant on nutrition & aerobic/resistive exercise along with prescribed medications to achieve LDL 70mg , HDL >40mg .    Expected Outcomes Short Term: Participant states understanding of desired cholesterol values and is compliant with medications prescribed. Participant is following exercise prescription and nutrition guidelines.;Long Term: Cholesterol controlled with medications as prescribed, with individualized exercise RX and with personalized nutrition plan. Value goals: LDL < 70mg , HDL > 40 mg.             Core Components/Risk Factors/Patient Goals Review:   Goals and Risk Factor Review     Row Name 04/24/23 1548             Core Components/Risk Factors/Patient Goals Review   Personal Goals Review Tobacco Cessation       Review Eddiel has recently quit tobacco use within the last 6 months. Intervention for relapse prevention was provided at the initial medical review. He was encouraged to continue to with tobacco cessation and was provided information on relapse prevention. Patient received information about combination therapy, tobacco cessation classes, quit line, and quit smoking apps in case of a relapse. Patient demonstrated understanding of this material.Staff will continue to provide encouragement and follow up with the patient throughout the program.       Expected Outcomes Short: Continued cessation Long: Long term cessation and work through relapses as needed                Core Components/Risk Factors/Patient Goals at Discharge (Final Review):   Goals and Risk Factor Review - 04/24/23 1548       Core Components/Risk Factors/Patient Goals Review   Personal Goals Review Tobacco Cessation    Review Arata has recently quit tobacco use within the last 6 months. Intervention for relapse prevention was provided at the initial medical review. He was  encouraged to continue to with tobacco cessation and was provided information on relapse prevention. Patient received information about combination therapy, tobacco cessation classes, quit line, and quit smoking apps in case of a relapse. Patient demonstrated understanding of this material.Staff will continue to provide encouragement and follow up with the patient throughout the program.    Expected Outcomes Short: Continued cessation Long: Long term cessation and work through relapses as needed             ITP Comments:  ITP Comments     Row Name 04/24/23 1529 04/25/23 1409         ITP Comments Completed virtual orientation today.  EP evaluation is scheduled for 04/25/23 at 1230.  Documentation for diagnosis can be found in [facility] encounter 04/04/23 and OV 04/19/23. Patient attend orientation today.  Patient is attendingCardiac Rehabilitation Program.  Documentation for diagnosis can be found in [facility] 04/04/23.  Reviewed medical chart, RPE/RPD, gym safety, and program guidelines.  Patient was fitted to equipment they will be using during  rehab.  Patient is scheduled to start exercise on 05/01/23 at 1330.   Initial ITP created and sent for review and signature by Dr. Dina Rich, Medical Director for Cardiac Rehabilitation Program.               Comments: Initial ITP

## 2023-04-25 NOTE — Patient Instructions (Signed)
Patient Instructions  Patient Details  Name: Dale Fowler MRN: [id number] Date of Birth: 1966-01-08 Referring Provider:  Lennette Bihari, MD  Below are your personal goals for exercise, nutrition, and risk factors. Our goal is to help you stay on track towards obtaining and maintaining these goals. We will be discussing your progress on these goals with you throughout the program.  Initial Exercise Prescription:  Initial Exercise Prescription - 04/25/23 1400       Date of Initial Exercise RX and Referring Provider   Date 04/25/23    Referring Provider Aleen Campi MD      Oxygen   Maintain Oxygen Saturation 88% or higher      Treadmill   MPH 2.4    Grade 0    Minutes 15    METs 2.84      REL-XR   Level 3    Speed 50    Minutes 15    METs 2.5      Prescription Details   Frequency (times per week) 2    Duration Progress to 30 minutes of continuous aerobic without signs/symptoms of physical distress      Intensity   THRR 40-80% of Max Heartrate 108-145    Ratings of Perceived Exertion 11-13    Perceived Dyspnea 0-4      Progression   Progression Continue to progress workloads to maintain intensity without signs/symptoms of physical distress.      Resistance Training   Training Prescription Yes    Weight 4 lb    Reps 10-15             Exercise Goals: Frequency: Be able to perform aerobic exercise two to three times per week in program working toward 2-5 days per week of home exercise.  Intensity: Work with a perceived exertion of 11 (fairly light) - 15 (hard) while following your exercise prescription.  We will make changes to your prescription with you as you progress through the program.   Duration: Be able to do 30 to 45 minutes of continuous aerobic exercise in addition to a 5 minute warm-up and a 5 minute cool-down routine.   Nutrition Goals: Your personal nutrition goals will be established when you do your nutrition analysis with the  dietician.  The following are general nutrition guidelines to follow: Cholesterol < 200mg /day Sodium < 1500mg /day Fiber: Men over 50 yrs - 30 grams per day  Personal Goals:  Personal Goals and Risk Factors at Admission - 04/25/23 1417       Core Components/Risk Factors/Patient Goals on Admission    Weight Management Yes;Weight Loss;Weight Maintenance    Intervention Weight Management: Develop a combined nutrition and exercise program designed to reach desired caloric intake, while maintaining appropriate intake of nutrient and fiber, sodium and fats, and appropriate energy expenditure required for the weight goal.;Weight Management: Provide education and appropriate resources to help participant work on and attain dietary goals.    Admit Weight 175 lb 1.6 oz (79.4 kg)    Goal Weight: Short Term 170 lb (77.1 kg)    Goal Weight: Long Term 170 lb (77.1 kg)    Expected Outcomes Short Term: Continue to assess and modify interventions until short term weight is achieved;Long Term: Adherence to nutrition and physical activity/exercise program aimed toward attainment of established weight goal;Weight Maintenance: Understanding of the daily nutrition guidelines, which includes 25-35% calories from fat, 7% or less cal from saturated fats, less than 200mg  cholesterol, less than 1.5gm of sodium, & 5 or  more servings of fruits and vegetables daily;Weight Loss: Understanding of general recommendations for a balanced deficit meal plan, which promotes 1-2 lb weight loss per week and includes a negative energy balance of [phone] kcal/d;Understanding recommendations for meals to include 15-35% energy as protein, 25-35% energy from fat, 35-60% energy from carbohydrates, less than 200mg  of dietary cholesterol, 20-35 gm of total fiber daily;Understanding of distribution of calorie intake throughout the day with the consumption of 4-5 meals/snacks    Tobacco Cessation Yes   quit 03/2023   Intervention Offer  self-teaching materials, assist with locating and accessing local/national Quit Smoking programs, and support quit date choice.    Expected Outcomes Short Term: Will quit all tobacco product use, adhering to prevention of relapse plan.;Long Term: Complete abstinence from all tobacco products for at least 12 months from quit date.    Hypertension Yes    Intervention Provide education on lifestyle modifcations including regular physical activity/exercise, weight management, moderate sodium restriction and increased consumption of fresh fruit, vegetables, and low fat dairy, alcohol moderation, and smoking cessation.;Monitor prescription use compliance.    Expected Outcomes Short Term: Continued assessment and intervention until BP is < 140/76mm HG in hypertensive participants. < 130/45mm HG in hypertensive participants with diabetes, heart failure or chronic kidney disease.;Long Term: Maintenance of blood pressure at goal levels.    Lipids Yes    Intervention Provide education and support for participant on nutrition & aerobic/resistive exercise along with prescribed medications to achieve LDL 70mg , HDL >40mg .    Expected Outcomes Short Term: Participant states understanding of desired cholesterol values and is compliant with medications prescribed. Participant is following exercise prescription and nutrition guidelines.;Long Term: Cholesterol controlled with medications as prescribed, with individualized exercise RX and with personalized nutrition plan. Value goals: LDL < 70mg , HDL > 40 mg.             Tobacco Use Initial Evaluation: Social History   Tobacco Use  Smoking Status Former   Current packs/day: 0.00   Average packs/day: 0.5 packs/day for 18.0 years (9.0 ttl pk-yrs)   Types: Cigarettes   Quit date: 03/2023   Years since quitting: 0.1  Smokeless Tobacco Not on file    Exercise Goals and Review:  Exercise Goals     Row Name 04/25/23 1412             Exercise Goals    Increase Physical Activity Yes       Intervention Provide advice, education, support and counseling about physical activity/exercise needs.;Develop an individualized exercise prescription for aerobic and resistive training based on initial evaluation findings, risk stratification, comorbidities and participant's personal goals.       Expected Outcomes Short Term: Attend rehab on a regular basis to increase amount of physical activity.;Long Term: Add in home exercise to make exercise part of routine and to increase amount of physical activity.;Long Term: Exercising regularly at least 3-5 days a week.       Increase Strength and Stamina Yes       Intervention Provide advice, education, support and counseling about physical activity/exercise needs.;Develop an individualized exercise prescription for aerobic and resistive training based on initial evaluation findings, risk stratification, comorbidities and participant's personal goals.       Expected Outcomes Short Term: Increase workloads from initial exercise prescription for resistance, speed, and METs.;Short Term: Perform resistance training exercises routinely during rehab and add in resistance training at home;Long Term: Improve cardiorespiratory fitness, muscular endurance and strength as measured by increased METs and functional  capacity ( )       Able to understand and use rate of perceived exertion (RPE) scale Yes       Intervention Provide education and explanation on how to use RPE scale       Expected Outcomes Short Term: Able to use RPE daily in rehab to express subjective intensity level;Long Term:  Able to use RPE to guide intensity level when exercising independently       Able to understand and use Dyspnea scale Yes       Intervention Provide education and explanation on how to use Dyspnea scale       Expected Outcomes Short Term: Able to use Dyspnea scale daily in rehab to express subjective sense of shortness of breath during  exertion;Long Term: Able to use Dyspnea scale to guide intensity level when exercising independently       Knowledge and understanding of Target Heart Rate Range (THRR) Yes       Intervention Provide education and explanation of THRR including how the numbers were predicted and where they are located for reference       Expected Outcomes Short Term: Able to state/look up THRR;Long Term: Able to use THRR to govern intensity when exercising independently;Short Term: Able to use daily as guideline for intensity in rehab       Able to check pulse independently Yes       Intervention Provide education and demonstration on how to check pulse in carotid and radial arteries.;Review the importance of being able to check your own pulse for safety during independent exercise       Expected Outcomes Short Term: Able to explain why pulse checking is important during independent exercise;Long Term: Able to check pulse independently and accurately       Understanding of Exercise Prescription Yes       Intervention Provide education, explanation, and written materials on patient's individual exercise prescription       Expected Outcomes Short Term: Able to explain program exercise prescription;Long Term: Able to explain home exercise prescription to exercise independently       Improve claudication pain toleration; Improve walking ability Yes       Intervention Attend education sessions to aid in risk factor modification and understanding of disease process       Expected Outcomes Short Term: Improve walking distance/time to onset of claudication pain;Long Term: Improve walking ability and toleration to claudication              Copy of goals given to participant.

## 2023-04-26 ENCOUNTER — Other Ambulatory Visit: Payer: Self-pay

## 2023-04-26 DIAGNOSIS — I6523 Occlusion and stenosis of bilateral carotid arteries: Secondary | ICD-10-CM

## 2023-04-30 ENCOUNTER — Telehealth: Payer: Self-pay | Admitting: Pharmacist

## 2023-04-30 ENCOUNTER — Telehealth: Payer: Self-pay

## 2023-04-30 ENCOUNTER — Other Ambulatory Visit (HOSPITAL_COMMUNITY): Payer: Self-pay

## 2023-04-30 ENCOUNTER — Ambulatory Visit: Payer: Medicare Other | Attending: Cardiovascular Disease | Admitting: Student

## 2023-04-30 DIAGNOSIS — E785 Hyperlipidemia, unspecified: Secondary | ICD-10-CM | POA: Diagnosis not present

## 2023-04-30 NOTE — Assessment & Plan Note (Signed)
Assessment:  LDL goal: < 70 mg/dl last LDLc 366 mg/dl (44/03) on Lipitor 40 mg  Tolerates high intensity statins well without any side effects Lipitor dose increased to 80 mg after MI - LDLc lowering effect ~6%  Addition of ezetimibe would lower another 15-20% of LDLc so to lower LDLc to goal <70 addition of PCSK9i is reasonable  Diet - mainly heart healthy; exercise- rehab twice week  Risk factors- CAD 04/04/23 s/p inferior MI and s/p DES to RCA. strong family hx of premature MI and stroke  Plan: Continue taking current medications - Lipitor 80 mg daily  Will apply for PA for PCSK9i; will inform patient upon approval  Lipid lab due in 2-3 months after starting [facility]

## 2023-04-30 NOTE — Progress Notes (Signed)
Patient ID: Dale Fowler                 DOB: 1966-09-11                    MRN: [id number]      HPI: Dale Fowler is a 57 y.o. male patient referred to lipid clinic by Dale Fowler. PMH is significant for CAD, hx STEMI 04/04/23 s/p inferior MI and s/p DES to RCA.    Patient presented today for lipid clinic with his girl friend. Reports after MI on July/24/24 his Lipitor dose was increased from 40 mg daily to 8 mg daily. He has been taking it regularly since then and tolerating it well. He is on heart healthy diet and he started rehab last week. Go to rehab session twice week. Reports he has strong family history of MI and stroke (mother and siblings)    Reviewed options for lowering LDL cholesterol, including ezetimibe, PCSK-9 inhibitors, bempedoic acid and inclisiran.  Discussed mechanisms of action, dosing, side effects and potential decreases in LDL cholesterol.  Also reviewed cost information and potential options for patient assistance.  Current Medications: Lipitor 80 mg daily  Intolerances: none  Risk Factors:CAD 04/04/23 s/p inferior MI and s/p DES to RCA. strong family hx of premature MI and stroke,  LDL goal: <70   Diet: heart healthy diet    Exercise: rehab - 2 times a week   Family History:  Mother: diabetes, HTN, stroke in her early 79's 2 MI ( age 60 at 1st MI)  Father: died from pneumonia, asthma   Siblings:  Older sister- hypertension ,heart problem Older brother: HF, HTN  Younger brother : MI at age 69  Sister: stroke - at age 33 years, HTN  Other brother- diabetes, hypertension died from Covid  Other sister- HTN, dementia Other Sister: breast cancer  Other sister- 2 strokes and blood disease    Social History:  Alcohol: Beer once or twice a month  Tobacco smoking: smoked for 28 years and quit July 2024  Recreational drug use: 1/2 joint per 2-3 times per week   Labs: Lipid Panel     Component Value Date/Time   CHOL 194 04/04/2023 0616   TRIG  65 04/04/2023 0616   HDL 38 (L) 04/04/2023 0616   CHOLHDL 5.1 04/04/2023 0616   VLDL 13 04/04/2023 0616   LDLCALC 143 (H) 04/04/2023 0616    Past Medical History:  Diagnosis Date   GERD (gastroesophageal reflux disease)    Hyperlipidemia    Hypertension    does not see a cardiologist   TIA (transient ischemic attack)    X2    Current Outpatient Medications on File Prior to Visit  Medication Sig Dispense Refill   aspirin 81 MG chewable tablet Chew 1 tablet (81 mg total) by mouth daily. 90 tablet 2   atorvastatin (LIPITOR) 80 MG tablet Take 1 tablet (80 mg total) by mouth daily. 90 tablet 1   diazepam (VALIUM) 2 MG tablet Take 2 mg by mouth daily as needed for anxiety.     metoprolol tartrate (LOPRESSOR) 25 MG tablet Take 0.5 tablets (12.5 mg total) by mouth 2 (two) times daily. 180 tablet 3   nicotine (NICODERM CQ - DOSED IN MG/24 HOURS) 14 mg/24hr patch Place 1 patch (14 mg total) onto the skin daily at 6 (six) AM. 28 patch 0   nitroGLYCERIN (NITROSTAT) 0.4 MG SL tablet Place 1 tablet (0.4 mg total) under the tongue every 5 (  five) minutes as needed. 25 tablet 2   tadalafil (CIALIS) 5 MG tablet Take 1 tablet (5 mg total) by mouth daily as needed for erectile dysfunction. 30 tablet 11   ticagrelor (BRILINTA) 90 MG TABS tablet Take 1 tablet (90 mg total) by mouth 2 (two) times daily. 180 tablet 3   No current facility-administered medications on file prior to visit.    Allergies  Allergen Reactions   Shellfish Allergy     FACIAL EDEMA    Assessment/Plan:  1. Hyperlipidemia -  Problem  Hyperlipidemia Ldl Goal <70   Current Medications: Lipitor 80 mg daily  Intolerances: none  Risk Factors:CAD 04/04/23 s/p inferior MI and s/p DES to RCA. strong family hx of premature MI and stroke,  LDL goal: <70     Hyperlipidemia LDL goal <70 Assessment:  LDL goal: < 70 mg/dl last LDLc 409 mg/dl (81/19) on Lipitor 40 mg  Tolerates high intensity statins well without any side  effects Lipitor dose increased to 80 mg after MI - LDLc lowering effect ~6%  Addition of ezetimibe would lower another 15-20% of LDLc so to lower LDLc to goal <70 addition of PCSK9i is reasonable  Diet - mainly heart healthy; exercise- rehab twice week  Risk factors- CAD 04/04/23 s/p inferior MI and s/p DES to RCA. strong family hx of premature MI and stroke  Plan: Continue taking current medications - Lipitor 80 mg daily  Will apply for PA for PCSK9i; will inform patient upon approval  Lipid lab due in 2-3 months after starting PCSK9i    Thank you,  Dale Fowler, Pharm.D [facility] Division of [facility] [address], [place], [place] [address]  Phone: [phone]; Fax: [phone]

## 2023-04-30 NOTE — Patient Instructions (Signed)
Your Results:             Your most recent labs Goal  Total Cholesterol 194 < 200  Triglycerides 65 < 150  HDL (happy/good cholesterol) 38 > 40  LDL (lousy/bad cholesterol 143 < 70   Medication changes:  Continue  taking Lipitor 80 mg daily  We will start the process to get PCSK9i (Repatha or Praluent)  covered by your insurance.  Once the prior authorization is complete, we will call you to let you know and confirm pharmacy information.     Praluent is a cholesterol medication that improved your body's ability to get rid of "bad cholesterol" known as LDL. It can lower your LDL up to 60%. It is an injection that is given under the skin every 2 weeks. The most common side effects of Praluent include runny nose, symptoms of the common cold, rarely flu or flu-like symptoms, back/muscle pain in about 3-4% of the patients, and redness, pain, or bruising at the injection site.    Repatha is a cholesterol medication that improved your body's ability to get rid of "bad cholesterol" known as LDL. It can lower your LDL up to 60%! It is an injection that is given under the skin every 2 weeks. The most common side effects of Repatha include runny nose, symptoms of the common cold, rarely flu or flu-like symptoms, back/muscle pain in about 3-4% of the patients, and redness, pain, or bruising at the injection site.   Lab orders: We want to repeat labs after 2-3 months.  We will send you a lab order to remind you once we get closer to that time.

## 2023-04-30 NOTE — Telephone Encounter (Signed)
Pharmacy Patient Advocate Encounter   Received notification from Physician's Office that prior authorization for REPATHA is required/requested.   Insurance verification completed.   The patient is insured through [facility] MEDICARE  .   Per test claim: PA required; PA submitted to [facility] MEDICARE via CoverMyMeds Key/confirmation #[id number] Status is pending

## 2023-04-30 NOTE — Telephone Encounter (Signed)
PA request has been Submitted. New Encounter created for follow up. For additional info see Pharmacy Prior Auth telephone encounter from 04/30/23.

## 2023-04-30 NOTE — Telephone Encounter (Signed)
PA requested for PCSK9i.

## 2023-05-01 ENCOUNTER — Encounter (HOSPITAL_COMMUNITY): Payer: Medicare Other

## 2023-05-01 ENCOUNTER — Other Ambulatory Visit (HOSPITAL_COMMUNITY): Payer: Self-pay

## 2023-05-01 DIAGNOSIS — L02415 Cutaneous abscess of right lower limb: Secondary | ICD-10-CM | POA: Diagnosis not present

## 2023-05-01 DIAGNOSIS — Z6826 Body mass index (BMI) 26.0-26.9, adult: Secondary | ICD-10-CM | POA: Diagnosis not present

## 2023-05-01 DIAGNOSIS — M25551 Pain in right hip: Secondary | ICD-10-CM | POA: Diagnosis not present

## 2023-05-01 NOTE — Telephone Encounter (Signed)
Pharmacy Patient Advocate Encounter  Received notification from [facility] MEDICARE  that Prior Authorization for REPATHA has been APPROVED from 04/30/23 to 04/29/24. Ran test claim, Copay is $45. This test claim was processed through [facility]- copay amounts may vary at other pharmacies due to pharmacy/plan contracts, or as the patient moves through the different stages of their insurance plan.

## 2023-05-02 ENCOUNTER — Other Ambulatory Visit: Payer: Self-pay | Admitting: Pharmacist

## 2023-05-02 DIAGNOSIS — E785 Hyperlipidemia, unspecified: Secondary | ICD-10-CM

## 2023-05-02 MED ORDER — REPATHA SURECLICK 140 MG/ML ~~LOC~~ SOAJ: 140.0000 mg | SUBCUTANEOUS | 3 refills | Status: DC

## 2023-05-02 NOTE — Telephone Encounter (Signed)
Patient informed about PA approval and prescription sent to [facility] and follow up lab is on Nov 6

## 2023-05-02 NOTE — Addendum Note (Signed)
Addended by: Tylene Fantasia on: 05/02/2023 09:51 AM   Modules accepted: Orders

## 2023-05-03 ENCOUNTER — Encounter (HOSPITAL_COMMUNITY)
Admission: RE | Admit: 2023-05-03 | Discharge: 2023-05-03 | Disposition: A | Discharge status: Home / Self Care | Payer: Medicare Other | Source: Ambulatory Visit | Attending: Interventional Cardiology | Admitting: Interventional Cardiology

## 2023-05-03 DIAGNOSIS — I2111 ST elevation (STEMI) myocardial infarction involving right coronary artery: Secondary | ICD-10-CM | POA: Diagnosis not present

## 2023-05-03 DIAGNOSIS — Z955 Presence of coronary angioplasty implant and graft: Secondary | ICD-10-CM | POA: Diagnosis not present

## 2023-05-03 NOTE — Progress Notes (Signed)
Daily Session Note  Patient Details  Name: Dale Fowler MRN: [id number] Date of Birth: 1966-05-24 Referring Provider:   Flowsheet Row CARDIAC REHAB PHASE II ORIENTATION from 04/25/2023 in [facility] CARD[facility]AC REHABILITATION  Referring Provider Aleen Campi MD       Encounter Date: 05/03/2023  Check In:  Session Check In - 05/03/23 1315       Check-In   Supervising physician immediately available to respond to emergencies [facility] MD immediately available    Physician(s) Dr. Diona Browner    Location [facility]    Staff Present Ross Ludwig, BS, Exercise Physiologist;Imogine Carvell BSN, RN    Virtual Visit No    Medication changes reported     Yes    Comments Pt started on Penicillin yesterday (05/02/23)    Fall or balance concerns reported    No    Tobacco Cessation No Change    Warm-up and Cool-down Performed on first and last piece of equipment    Resistance Training Performed Yes    VAD Patient? No    PAD/SET Patient? No      Pain Assessment   Currently in Pain? No/denies    Multiple Pain Sites No             Capillary Blood Glucose: No results found for this or any previous visit (from the past 24 hour(s)).    Social History   Tobacco Use  Smoking Status Former   Current packs/day: 0.00   Average packs/day: 0.5 packs/day for 18.0 years (9.0 ttl pk-yrs)   Types: Cigarettes   Quit date: 03/2023   Years since quitting: 0.1  Smokeless Tobacco Not on file    Goals Met:  Independence with exercise equipment Exercise tolerated well No report of concerns or symptoms today Strength training completed today  Goals Unmet:  Not Applicable  Comments: Marland KitchenMarland KitchenFirst full day of exercise!  Patient was oriented to gym and equipment including functions, settings, policies, and procedures.  Patient's individual exercise prescription and treatment plan were reviewed.  All starting workloads were established based on the results of the 6 minute walk test  done at initial orientation visit.  The plan for exercise progression was also introduced and progression will be customized based on patient's performance and goals.    Dr. Dina Rich is Medical Director for [facility] Cardiac Rehab

## 2023-05-08 ENCOUNTER — Encounter (HOSPITAL_COMMUNITY): Admission: RE | Admit: 2023-05-08 | Payer: Medicare Other | Source: Ambulatory Visit

## 2023-05-08 DIAGNOSIS — I2111 ST elevation (STEMI) myocardial infarction involving right coronary artery: Secondary | ICD-10-CM

## 2023-05-08 DIAGNOSIS — Z955 Presence of coronary angioplasty implant and graft: Secondary | ICD-10-CM | POA: Diagnosis not present

## 2023-05-08 NOTE — Progress Notes (Signed)
Daily Session Note  Patient Details  Name: Dale Fowler MRN: [id number] Date of Birth: [birth year] Referring Provider:   Flowsheet Row CARDIAC REHAB PHASE II ORIENTATION from 04/25/2023 in [facility] CARDIAC REH[facility]  Referring Provider Aleen Campi MD       Encounter Date: 05/08/2023  Check In:  Session Check In - 05/08/23 1329       Check-In   Supervising physician immediately available to respond to emergencies See telemetry face sheet for immediately available MD    Location [facility]    Staff Present Ross Ludwig, BS, Exercise Physiologist;Anil Havard Daphine Deutscher, RN, BSN    Virtual Visit No    Medication changes reported     No    Fall or balance concerns reported    No    Tobacco Cessation No Change    Warm-up and Cool-down Performed on first and last piece of equipment    Resistance Training Performed Yes    VAD Patient? No      Pain Assessment   Currently in Pain? No/denies             Capillary Blood Glucose: No results found for this or any previous visit (from the past 24 hour(s)).    Social History   Tobacco Use  Smoking Status Former   Current packs/day: 0.00   Average packs/day: 0.5 packs/day for 18.0 years (9.0 ttl pk-yrs)   Types: Cigarettes   Quit date: 03/2023   Years since quitting: 0.1  Smokeless Tobacco Not on file    Goals Met:  Independence with exercise equipment Exercise tolerated well No report of concerns or symptoms today Strength training completed today  Goals Unmet:  Not Applicable  Comments: Pt able to follow exercise prescription today without complaint.  Will continue to monitor for progression.    Dr. Dina Rich is Medical Director for [facility] Cardiac Rehab

## 2023-05-10 ENCOUNTER — Encounter (HOSPITAL_COMMUNITY)
Admission: RE | Admit: 2023-05-10 | Discharge: 2023-05-10 | Disposition: A | Discharge status: Home / Self Care | Payer: Medicare Other | Source: Ambulatory Visit | Attending: Interventional Cardiology | Admitting: Interventional Cardiology

## 2023-05-10 DIAGNOSIS — Z955 Presence of coronary angioplasty implant and graft: Secondary | ICD-10-CM

## 2023-05-10 DIAGNOSIS — I2111 ST elevation (STEMI) myocardial infarction involving right coronary artery: Secondary | ICD-10-CM | POA: Diagnosis not present

## 2023-05-10 NOTE — Progress Notes (Signed)
Daily Session Note  Patient Details  Name: Dale Fowler MRN: [id number] Date of Birth: Feb 07, 1966 Referring Provider:   Flowsheet Row CARDIAC REHAB PHASE II ORIENTATION from 04/25/2023 in [facility] CARDIAC REHABILITATION  Referring Provider Aleen Campi MD       Encounter Date: 05/10/2023  Check In:  Session Check In - 05/10/23 1330       Check-In   Supervising physician immediately available to respond to emergencies See telemetry face sheet for immediately available MD    Location [facility]    Staff Present Fabio Pierce, MA, RCEP, CCRP, CCET;Hillary Linden BSN, RN;Cinthya Bors Trilla, RN, BSN    Virtual Visit No    Medication changes reported     No    Fall or balance concerns reported    No    Tobacco Cessation No Change    Warm-up and Cool-down Performed on first and last piece of equipment    Resistance Training Performed Yes    VAD Patient? No      Pain Assessment   Currently in Pain? No/denies             Capillary Blood Glucose: No results found for this or any previous visit (from the past 24 hour(s)).    Social History   Tobacco Use  Smoking Status Former   Current packs/day: 0.00   Average packs/day: 0.5 packs/day for 18.0 years (9.0 ttl pk-yrs)   Types: Cigarettes   Quit date: 03/2023   Years since quitting: 0.1  Smokeless Tobacco Not on file    Goals Met:  Independence with exercise equipment Exercise tolerated well No report of concerns or symptoms today Strength training completed today  Goals Unmet:  Not Applicable  Comments: Pt able to follow exercise prescription today without complaint.  Will continue to monitor for progression.    Dr. Dina Rich is Medical Director for [facility]

## 2023-05-15 ENCOUNTER — Encounter (HOSPITAL_COMMUNITY): Payer: Medicare Other

## 2023-05-17 ENCOUNTER — Encounter (HOSPITAL_COMMUNITY)
Admission: RE | Admit: 2023-05-17 | Discharge: 2023-05-17 | Disposition: A | Discharge status: Home / Self Care | Payer: Medicare Other | Source: Ambulatory Visit | Attending: Interventional Cardiology | Admitting: Interventional Cardiology

## 2023-05-17 DIAGNOSIS — I2111 ST elevation (STEMI) myocardial infarction involving right coronary artery: Secondary | ICD-10-CM | POA: Insufficient documentation

## 2023-05-17 DIAGNOSIS — Z955 Presence of coronary angioplasty implant and graft: Secondary | ICD-10-CM | POA: Insufficient documentation

## 2023-05-17 NOTE — Progress Notes (Signed)
Daily Session Note  Patient Details  Name: Dale Fowler MRN: [id number] Date of Birth: 1966/07/07 Referring Provider:   Flowsheet Row CARDIAC REHAB PHASE II ORIENTATION from 04/25/2023 in [facility] CARDIAC REHABILITATION  Referring Provider Aleen Campi MD       Encounter Date: 05/17/2023  Check In:  Session Check In - 05/17/23 1351       Check-In   Supervising physician immediately available to respond to emergencies See telemetry face sheet for immediately available MD    Location [facility]    Staff Present Fabio Pierce, MA, RCEP, CCRP, Harolyn Rutherford, RN, BSN;Hillary Troutman BSN, RN    Virtual Visit No    Medication changes reported     No    Fall or balance concerns reported    No    Warm-up and Cool-down Performed on first and last piece of equipment    Resistance Training Performed Yes    VAD Patient? No    PAD/SET Patient? No      Pain Assessment   Currently in Pain? No/denies             Capillary Blood Glucose: No results found for this or any previous visit (from the past 24 hour(s)).    Social History   Tobacco Use  Smoking Status Former   Current packs/day: 0.00   Average packs/day: 0.5 packs/day for 18.0 years (9.0 ttl pk-yrs)   Types: Cigarettes   Quit date: 03/2023   Years since quitting: 0.1  Smokeless Tobacco Not on file    Goals Met:  Independence with exercise equipment Exercise tolerated well No report of concerns or symptoms today Strength training completed today  Goals Unmet:  Not Applicable  Comments: Pt able to follow exercise prescription today without complaint.  Will continue to monitor for progression.    Dr. Dina Rich is Medical Director for [facility]

## 2023-05-22 ENCOUNTER — Encounter (HOSPITAL_COMMUNITY)
Admission: RE | Admit: 2023-05-22 | Discharge: 2023-05-22 | Disposition: A | Discharge status: Home / Self Care | Payer: Medicare Other | Source: Ambulatory Visit | Attending: Interventional Cardiology

## 2023-05-22 DIAGNOSIS — Z955 Presence of coronary angioplasty implant and graft: Secondary | ICD-10-CM

## 2023-05-22 DIAGNOSIS — I2111 ST elevation (STEMI) myocardial infarction involving right coronary artery: Secondary | ICD-10-CM

## 2023-05-22 NOTE — Progress Notes (Signed)
Daily Session Note  Patient Details  Name: Dale Fowler MRN: [id number] Date of Birth: [birth year] Referring Provider:   Flowsheet Row CARDIAC REHAB PHASE II ORIENTATION from 04/25/2023 in [facility] CARDIAC REHABILITATION  Referring Provider Aleen Campi MD       Encounter Date: 05/22/2023  Check In:  Session Check In - 05/22/23 1330       Check-In   Supervising physician immediately available to respond to emergencies See telemetry face sheet for immediately available MD    Location [facility]    Staff Present Ross Ludwig, BS, Exercise Physiologist;Phyllis Billingsley, RN;Elvy Mclarty Daphine Deutscher, RN, BSN    Virtual Visit No    Medication changes reported     No    Fall or balance concerns reported    No    Tobacco Cessation No Change    Warm-up and Cool-down Performed on first and last piece of equipment    Resistance Training Performed Yes    VAD Patient? No      Pain Assessment   Currently in Pain? No/denies             Capillary Blood Glucose: No results found for this or any previous visit (from the past 24 hour(s)).    Social History   Tobacco Use  Smoking Status Former   Current packs/day: 0.00   Average packs/day: 0.5 packs/day for 18.0 years (9.0 ttl pk-yrs)   Types: Cigarettes   Quit date: 03/2023   Years since quitting: 0.1  Smokeless Tobacco Not on file    Goals Met:  Proper associated with RPD/PD & O2 Sat Independence with exercise equipment Using PLB without cueing & demonstrates good technique Exercise tolerated well Queuing for purse lip breathing No report of concerns or symptoms today Strength training completed today  Goals Unmet:  Not Applicable  Comments: Pt able to follow exercise prescription today without complaint.  Will continue to monitor for progression.    Dr. Erick Blinks is Medical Director for [facility].

## 2023-05-23 ENCOUNTER — Encounter (HOSPITAL_COMMUNITY): Payer: Self-pay | Admitting: *Deleted

## 2023-05-23 DIAGNOSIS — Z955 Presence of coronary angioplasty implant and graft: Secondary | ICD-10-CM

## 2023-05-23 DIAGNOSIS — I2111 ST elevation (STEMI) myocardial infarction involving right coronary artery: Secondary | ICD-10-CM

## 2023-05-23 NOTE — Progress Notes (Signed)
Cardiac Individual Treatment Plan  Patient Details  Name: Dale Fowler MRN: [id number] Date of Birth: 1966/04/09 Referring Provider:   Flowsheet Row CARDIAC REHAB PHASE II ORIENTATION from 04/25/2023 in [facility] CARDIAC REHABILITATION  Referring Provider Aleen Campi MD       Initial Encounter Date:  Flowsheet Row CARDIAC REHAB PHASE II ORIENTATION from 04/25/2023 in [facility] [place] CARDIAC REHABILITATION  Date 04/25/23       Visit Diagnosis: ST elevation myocardial infarction involving right coronary artery [facility])  Status post coronary artery stent placement  Patient's Home Medications on Admission:  Current Outpatient Medications:    aspirin 81 MG chewable tablet, Chew 1 tablet (81 mg total) by mouth daily., Disp: 90 tablet, Rfl: 2   atorvastatin (LIPITOR) 80 MG tablet, Take 1 tablet (80 mg total) by mouth daily., Disp: 90 tablet, Rfl: 1   diazepam (VALIUM) 2 MG tablet, Take 2 mg by mouth daily as needed for anxiety., Disp: , Rfl:    Evolocumab (REPATHA SURECLICK) 140 MG/ML SOAJ, Inject 140 mg into the skin every 14 (fourteen) days., Disp: 6 mL, Rfl: 3   metoprolol tartrate (LOPRESSOR) 25 MG tablet, Take 0.5 tablets (12.5 mg total) by mouth 2 (two) times daily., Disp: 180 tablet, Rfl: 3   nicotine (NICODERM CQ - DOSED IN MG/24 HOURS) 14 mg/24hr patch, Place 1 patch (14 mg total) onto the skin daily at 6 (six) AM., Disp: 28 patch, Rfl: 0   nitroGLYCERIN (NITROSTAT) 0.4 MG SL tablet, Place 1 tablet (0.4 mg total) under the tongue every 5 (five) minutes as needed., Disp: 25 tablet, Rfl: 2   tadalafil (CIALIS) 5 MG tablet, Take 1 tablet (5 mg total) by mouth daily as needed for erectile dysfunction., Disp: 30 tablet, Rfl: 11   ticagrelor (BRILINTA) 90 MG TABS tablet, Take 1 tablet (90 mg total) by mouth 2 (two) times daily., Disp: 180 tablet, Rfl: 3  Past Medical History: Past Medical History:  Diagnosis Date   GERD (gastroesophageal reflux disease)    Hyperlipidemia     Hypertension    does not see a cardiologist   TIA (transient ischemic attack)    X2    Tobacco Use: Social History   Tobacco Use  Smoking Status Former   Current packs/day: 0.00   Average packs/day: 0.5 packs/day for 18.0 years (9.0 ttl pk-yrs)   Types: Cigarettes   Quit date: 03/2023   Years since quitting: 0.1  Smokeless Tobacco Not on file    Labs: Review Flowsheet       Latest Ref Rng & Units 04/04/2023  Labs for ITP Cardiac and Pulmonary Rehab  Cholestrol 0 - 200 mg/dL 132   LDL (calc) 0 - 99 mg/dL 440   HDL-C >10 mg/dL 38   Trlycerides <272 mg/dL 65   Hemoglobin Z3G 4.8 - 5.6 % 5.7   TCO2 22 - 32 mmol/L 25     Details            Capillary Blood Glucose: No results found for: "GLUCAP"   Exercise Target Goals: Exercise Program Goal: Individual exercise prescription set using results from initial 6 min walk test and THRR while considering  patient's activity barriers and safety.   Exercise Prescription Goal: Starting with aerobic activity 30 plus minutes a day, 3 days per week for initial exercise prescription. Provide home exercise prescription and guidelines that participant acknowledges understanding prior to discharge.  Activity Barriers & Risk Stratification:  Activity Barriers & Cardiac Risk Stratification - 04/24/23 1520  Activity Barriers & Cardiac Risk Stratification   Activity Barriers Deconditioning;Muscular Weakness;Other (comment);Back Problems;Joint Problems;Shortness of Breath    Comments Left leg claudication, back pain from previous surgeries, TIA x2 residuals on right side (droop/speech)    Cardiac Risk Stratification High             6 Minute Walk:  6 Minute Walk     Row Name 04/25/23 1410         6 Minute Walk   Phase Initial     Distance 1300 feet     Walk Time 6 minutes     # of Rest Breaks 0     MPH 2.46     METS 3.76     RPE 15     VO2 Peak 13.16     Symptoms Yes (comment)     Comments claudication pain  4/5     Resting HR 71 bpm     Resting BP 126/72     Resting Oxygen Saturation  96 %     Exercise Oxygen Saturation  during 6 min walk 98 %     Max Ex. HR 107 bpm     Max Ex. BP 134/78     2 Minute Post BP 124/60              Oxygen Initial Assessment:   Oxygen Re-Evaluation:   Oxygen Discharge (Final Oxygen Re-Evaluation):   Initial Exercise Prescription:  Initial Exercise Prescription - 04/25/23 1400       Date of Initial Exercise RX and Referring Provider   Date 04/25/23    Referring Provider Aleen Campi MD      Oxygen   Maintain Oxygen Saturation 88% or higher      Treadmill   MPH 2.4    Grade 0    Minutes 15    METs 2.84      REL-XR   Level 3    Speed 50    Minutes 15    METs 2.5      Prescription Details   Frequency (times per week) 2    Duration Progress to 30 minutes of continuous aerobic without signs/symptoms of physical distress      Intensity   THRR 40-80% of Max Heartrate 108-145    Ratings of Perceived Exertion 11-13    Perceived Dyspnea 0-4      Progression   Progression Continue to progress workloads to maintain intensity without signs/symptoms of physical distress.      Resistance Training   Training Prescription Yes    Weight 4 lb    Reps 10-15             Perform Capillary Blood Glucose checks as needed.  Exercise Prescription Changes:   Exercise Prescription Changes     Row Name 04/25/23 1400 05/10/23 1400           Response to Exercise   Blood Pressure (Admit) 126/72 144/84      Blood Pressure (Exercise) 134/78 132/76      Blood Pressure (Exit) 124/60 132/74      Heart Rate (Admit) 71 bpm 81 bpm      Heart Rate (Exercise) 107 bpm 105 bpm      Heart Rate (Exit) 63 bpm 84 bpm      Oxygen Saturation (Admit) 96 % --      Oxygen Saturation (Exercise) 98 % --      Rating of Perceived Exertion (Exercise) 15 13      Symptoms  claudication pain 4/5 --      Comments walk test results --      Duration -- Continue  with 30 min of aerobic exercise without signs/symptoms of physical distress.      Intensity -- THRR unchanged        Progression   Progression -- Continue to progress workloads to maintain intensity without signs/symptoms of physical distress.        Resistance Training   Training Prescription -- Yes      Weight -- 4 / blue band      Reps -- 10-15        Treadmill   MPH -- 2      Grade -- 0.5      Minutes -- 15      METs -- 2.67        REL-XR   Level -- 4      Speed -- 51      Minutes -- 15      METs -- 3.7        Oxygen   Maintain Oxygen Saturation -- 88% or higher               Exercise Comments:   Exercise Goals and Review:   Exercise Goals     Row Name 04/25/23 1412             Exercise Goals   Increase Physical Activity Yes       Intervention Provide advice, education, support and counseling about physical activity/exercise needs.;Develop an individualized exercise prescription for aerobic and resistive training based on initial evaluation findings, risk stratification, comorbidities and participant's personal goals.       Expected Outcomes Short Term: Attend rehab on a regular basis to increase amount of physical activity.;Long Term: Add in home exercise to make exercise part of routine and to increase amount of physical activity.;Long Term: Exercising regularly at least 3-5 days a week.       Increase Strength and Stamina Yes       Intervention Provide advice, education, support and counseling about physical activity/exercise needs.;Develop an individualized exercise prescription for aerobic and resistive training based on initial evaluation findings, risk stratification, comorbidities and participant's personal goals.       Expected Outcomes Short Term: Increase workloads from initial exercise prescription for resistance, speed, and METs.;Short Term: Perform resistance training exercises routinely during rehab and add in resistance training at home;Long  Term: Improve cardiorespiratory fitness, muscular endurance and strength as measured by increased METs and functional capacity ( )       Able to understand and use rate of perceived exertion (RPE) scale Yes       Intervention Provide education and explanation on how to use RPE scale       Expected Outcomes Short Term: Able to use RPE daily in rehab to express subjective intensity level;Long Term:  Able to use RPE to guide intensity level when exercising independently       Able to understand and use Dyspnea scale Yes       Intervention Provide education and explanation on how to use Dyspnea scale       Expected Outcomes Short Term: Able to use Dyspnea scale daily in rehab to express subjective sense of shortness of breath during exertion;Long Term: Able to use Dyspnea scale to guide intensity level when exercising independently       Knowledge and understanding of Target Heart Rate Range (THRR) Yes  Intervention Provide education and explanation of THRR including how the numbers were predicted and where they are located for reference       Expected Outcomes Short Term: Able to state/look up THRR;Long Term: Able to use THRR to govern intensity when exercising independently;Short Term: Able to use daily as guideline for intensity in rehab       Able to check pulse independently Yes       Intervention Provide education and demonstration on how to check pulse in carotid and radial arteries.;Review the importance of being able to check your own pulse for safety during independent exercise       Expected Outcomes Short Term: Able to explain why pulse checking is important during independent exercise;Long Term: Able to check pulse independently and accurately       Understanding of Exercise Prescription Yes       Intervention Provide education, explanation, and written materials on patient's individual exercise prescription       Expected Outcomes Short Term: Able to explain program exercise  prescription;Long Term: Able to explain home exercise prescription to exercise independently       Improve claudication pain toleration; Improve walking ability Yes       Intervention Attend education sessions to aid in risk factor modification and understanding of disease process       Expected Outcomes Short Term: Improve walking distance/time to onset of claudication pain;Long Term: Improve walking ability and toleration to claudication                Exercise Goals Re-Evaluation :  Exercise Goals Re-Evaluation     Row Name 04/25/23 1413 05/10/23 1332 05/15/23 1425         Exercise Goal Re-Evaluation   Exercise Goals Review Able to understand and use rate of perceived exertion (RPE) scale;Able to understand and use Dyspnea scale Increase Physical Activity;Increase Strength and Stamina;Understanding of Exercise Prescription Increase Physical Activity;Able to understand and use rate of perceived exertion (RPE) scale;Understanding of Exercise Prescription;Knowledge and understanding of Target Heart Rate Range (THRR);Able to check pulse independently;Increase Strength and Stamina     Comments Reviewed RPE  and dyspnea scale, and program prescription with pt today.  Pt voiced understanding and was given a copy of goals to take home. Pt is on his third exercise session in rehab.  He states that he does feel like he has more endurance since starting the program.  Pt increased to level 4 on the elliptical today, and he thinks he has also increased his speed on the treadmill. Pt is doing well in rehab so far. He is currently exercising at level 4 on the XR and walking a speed of 2 on the treadmill. Will monitor and progress as able.     Expected Outcomes Short: Use RPE daily to regulate intensity.  Long: Follow program prescription Short term:  Go over home exercise routine  Long term:  Continue to come to rehab 2 days a week to improve strength/endurance Short term: continue to exericse at worklaods  until stamina has increaseed   long term: continue to attend cardiac rehab               Discharge Exercise Prescription (Final Exercise Prescription Changes):  Exercise Prescription Changes - 05/10/23 1400       Response to Exercise   Blood Pressure (Admit) 144/84    Blood Pressure (Exercise) 132/76    Blood Pressure (Exit) 132/74    Heart Rate (Admit) 81 bpm  Heart Rate (Exercise) 105 bpm    Heart Rate (Exit) 84 bpm    Rating of Perceived Exertion (Exercise) 13    Duration Continue with 30 min of aerobic exercise without signs/symptoms of physical distress.    Intensity THRR unchanged      Progression   Progression Continue to progress workloads to maintain intensity without signs/symptoms of physical distress.      Resistance Training   Training Prescription Yes    Weight 4 / blue band    Reps 10-15      Treadmill   MPH 2    Grade 0.5    Minutes 15    METs 2.67      REL-XR   Level 4    Speed 51    Minutes 15    METs 3.7      Oxygen   Maintain Oxygen Saturation 88% or higher             Nutrition:  Target Goals: Understanding of nutrition guidelines, daily intake of sodium 1500mg , cholesterol 200mg , calories 30% from fat and 7% or less from saturated fats, daily to have 5 or more servings of fruits and vegetables.  Biometrics:  Pre Biometrics - 04/25/23 1414       Pre Biometrics   Height 5\' 7"  (1.702 m)    Weight 175 lb 1.6 oz (79.4 kg)    Waist Circumference 34 inches    Hip Circumference 36 inches    Waist to Hip Ratio 0.94 %    BMI (Calculated) 27.42    Grip Strength 35.8 kg    Single Leg Stand 27.6 seconds              Nutrition Therapy Plan and Nutrition Goals:  Nutrition Therapy & Goals - 04/24/23 1547       Intervention Plan   Intervention Prescribe, educate and counsel regarding individualized specific dietary modifications aiming towards targeted core components such as weight, hypertension, lipid management, diabetes,  heart failure and other comorbidities.;Nutrition handout(s) given to patient.    Expected Outcomes Short Term Goal: Understand basic principles of dietary content, such as calories, fat, sodium, cholesterol and nutrients.;Short Term Goal: A plan has been developed with personal nutrition goals set during dietitian appointment.;Long Term Goal: Adherence to prescribed nutrition plan.             Nutrition Assessments:  MEDIFICTS Score Key: >=70 Need to make dietary changes  40-70 Heart Healthy Diet <= 40 Therapeutic Level Cholesterol Diet  Flowsheet Row CARDIAC REHAB PHASE II ORIENTATION from 04/25/2023 in [facility] CARDIAC REHABILITATION  Picture Your Plate Total Score on Admission 58 (P)       Picture Your Plate Scores: <16 Unhealthy dietary pattern with much room for improvement. 41-50 Dietary pattern unlikely to meet recommendations for good health and room for improvement. 51-60 More healthful dietary pattern, with some room for improvement.  >60 Healthy dietary pattern, although there may be some specific behaviors that could be improved.    Nutrition Goals Re-Evaluation:  Nutrition Goals Re-Evaluation     Row Name 05/10/23 1345             Goals   Nutrition Goal Heart healthy diet       Comment Pt eats2 meals a day and snacks at night. Pt does not eat a lot of red meat, he prefers seafood which is usually baked.  Pt's girlfriend is helping with his healthy nutrition by buying fresh fruit and vegetables.  Pt stated  that he does eat sweets every day, and he especially likes cakes.  We discussed trying to limit his sweets to 3-4 days a week and also not snacking as much at night after 7 pm.       Expected Outcome Short term:  Pt will try to limit his sweets to 3-4 days a week  Long term:  Pt will try to limit his snacking at night                Nutrition Goals Discharge (Final Nutrition Goals Re-Evaluation):  Nutrition Goals Re-Evaluation - 05/10/23 1345        Goals   Nutrition Goal Heart healthy diet    Comment Pt eats2 meals a day and snacks at night. Pt does not eat a lot of red meat, he prefers seafood which is usually baked.  Pt's girlfriend is helping with his healthy nutrition by buying fresh fruit and vegetables.  Pt stated that he does eat sweets every day, and he especially likes cakes.  We discussed trying to limit his sweets to 3-4 days a week and also not snacking as much at night after 7 pm.    Expected Outcome Short term:  Pt will try to limit his sweets to 3-4 days a week  Long term:  Pt will try to limit his snacking at night             Psychosocial: Target Goals: Acknowledge presence or absence of significant depression and/or stress, maximize coping skills, provide positive support system. Participant is able to verbalize types and ability to use techniques and skills needed for reducing stress and depression.  Initial Review & Psychosocial Screening:  Initial Psych Review & Screening - 04/24/23 1523       Initial Review   Current issues with History of Depression;Current Anxiety/Panic;Current Depression;Current Stress Concerns;Current Sleep Concerns    Source of Stress Concerns Chronic Illness;Family    Comments not a good sleeping recently has been more restless at night, depression worse since heart event, several deaths in the family      Family Dynamics   Good Support System? Yes   girlfriend and kids live with them 14, 20, 25     Barriers   Psychosocial barriers to participate in program The patient should benefit from training in stress management and relaxation.;Psychosocial barriers identified (see note)      Screening Interventions   Interventions Encouraged to exercise;Provide feedback about the scores to participant;To provide support and resources with identified psychosocial needs    Expected Outcomes Short Term goal: Utilizing psychosocial counselor, staff and physician to assist with identification of  specific Stressors or current issues interfering with healing process. Setting desired goal for each stressor or current issue identified.;Long Term Goal: Stressors or current issues are controlled or eliminated.;Short Term goal: Identification and review with participant of any Quality of Life or Depression concerns found by scoring the questionnaire.;Long Term goal: The participant improves quality of Life and PHQ9 Scores as seen by post scores and/or verbalization of changes             Quality of Life Scores:  Quality of Life - 04/25/23 1414       Quality of Life   Select Quality of Life      Quality of Life Scores   Health/Function Pre 22.39 %    Socioeconomic Pre 25 %    Psych/Spiritual Pre 30 %    Family Pre 30 %    GLOBAL Pre  25.71 %            Scores of 19 and below usually indicate a poorer quality of life in these areas.  A difference of  2-3 points is a clinically meaningful difference.  A difference of 2-3 points in the total score of the Quality of Life Index has been associated with significant improvement in overall quality of life, self-image, physical symptoms, and general health in studies assessing change in quality of life.  PHQ-9: Review Flowsheet       05/22/2023 04/25/2023  Depression screen PHQ 2/9  Decreased Interest 2 3  Down, Depressed, Hopeless 1 1  PHQ - 2 Score 3 4  Altered sleeping 3 1  Tired, decreased energy 2 3  Change in appetite 3 3  Feeling bad or failure about yourself  2 2  Trouble concentrating 3 3  Moving slowly or fidgety/restless 1 1  Suicidal thoughts 0 0  PHQ-9 Score 17 17  Difficult doing work/chores Somewhat difficult Somewhat difficult    Details           Interpretation of Total Score  Total Score Depression Severity:  1-4 = Minimal depression, 5-9 = Mild depression, 10-14 = Moderate depression, 15-19 = Moderately severe depression, 20-27 = Severe depression   Psychosocial Evaluation and Intervention:   Psychosocial Evaluation - 04/24/23 1541       Psychosocial Evaluation & Interventions   Comments Wilkens is coming into rehab after STEMI and stent.  He is looking forward to rehab to build back his stamina and getting back to his normal.  He and his girlfriend want to get his confidence back and get him back to himself again.  He does have a history of TIA x2 which has left him with some residual weakness on right side and slurred speech with some facial droop, but he does not let this slow him down.  He also has a history of depression, but it seems to have worsened since his heart even along with more anxiety.  They also mentioned that there have been several deaths in the family recently, which has him grieving more.  He lives with his girlfriend and their 3 kids who are all very supportive.  They noted that he has been a lot more restless since his heart event and not sleeping as well.  We talked about how exercise could help and if it doesn't then talking to the doctor about it as they have not brought it up yet.  They were both open to this.  We encouraged his girlfriend to come tomorrow for orientation and to attend education classes as well if she wanted.  His barriers to rehab are his chronic back pain and claudication pain which both limit his activity.  We talked through how we would work around these during rehab.  He quit smoking after his heart attack and was commended on his success.  So far he is doing well with it, but this could be adding to some of his anxiety too.    Expected Outcomes Short: Attend rehab to build stamina and confidence Long: Continue to work through grieving process    Continue Psychosocial Services  Follow up required by staff             Psychosocial Re-Evaluation:  Psychosocial Re-Evaluation     Row Name 05/10/23 1337             Psychosocial Re-Evaluation   Current issues with Current Sleep Concerns;Current Stress Concerns;History  of Depression        Comments Pt still has trouble sleeping, and he takes Valium as needed for anxiety/sleep.  He is not interested in trying any OTC or prescription sleep aids at this time. The pt stated that he falls asleep watching tv every night.  He does still have stress related to his recent health problems; however, he has a good support system with his 5 kids and his girlfriend.       Expected Outcomes Short term: Pt will try to cut the tv off earlier to see if this will assist with him getting to sleep sooner  Long term:  Pt will continue to have no identifiable psychosocial barriers       Interventions Encouraged to attend Cardiac Rehabilitation for the exercise;Stress management education;Relaxation education       Continue Psychosocial Services  Follow up required by staff                Psychosocial Discharge (Final Psychosocial Re-Evaluation):  Psychosocial Re-Evaluation - 05/10/23 1337       Psychosocial Re-Evaluation   Current issues with Current Sleep Concerns;Current Stress Concerns;History of Depression    Comments Pt still has trouble sleeping, and he takes Valium as needed for anxiety/sleep.  He is not interested in trying any OTC or prescription sleep aids at this time. The pt stated that he falls asleep watching tv every night.  He does still have stress related to his recent health problems; however, he has a good support system with his 5 kids and his girlfriend.    Expected Outcomes Short term: Pt will try to cut the tv off earlier to see if this will assist with him getting to sleep sooner  Long term:  Pt will continue to have no identifiable psychosocial barriers    Interventions Encouraged to attend Cardiac Rehabilitation for the exercise;Stress management education;Relaxation education    Continue Psychosocial Services  Follow up required by staff             Vocational Rehabilitation: Provide vocational rehab assistance to qualifying candidates.   Vocational Rehab Evaluation  & Intervention:  Vocational Rehab - 04/24/23 1522       Initial Vocational Rehab Evaluation & Intervention   Assessment shows need for Vocational Rehabilitation No   disabled            Education: Education Goals: Education classes will be provided on a weekly basis, covering required topics. Participant will state understanding/return demonstration of topics presented.  Learning Barriers/Preferences:  Learning Barriers/Preferences - 04/24/23 1518       Learning Barriers/Preferences   Learning Barriers Sight   glasses for reading   Learning Preferences Skilled Demonstration             Education Topics: Hypertension, Hypertension Reduction -Define heart disease and high blood pressure. Discus how high blood pressure affects the body and ways to reduce high blood pressure.   Exercise and Your Heart -Discuss why it is important to exercise, the FITT principles of exercise, normal and abnormal responses to exercise, and how to exercise safely.   Angina -Discuss definition of angina, causes of angina, treatment of angina, and how to decrease risk of having angina.   Cardiac Medications -Review what the following cardiac medications are used for, how they affect the body, and side effects that may occur when taking the medications.  Medications include Aspirin, Beta blockers, calcium channel blockers, ACE Inhibitors, angiotensin receptor blockers, diuretics, digoxin, and antihyperlipidemics. Flowsheet Row  CARDIAC REHAB PHASE II EXERCISE from 05/17/2023 in [facility] C[facility]IAC REHABILITATION  Date 05/10/23  Educator [facility]  Instruction Review Code 2- Demonstrated Understanding       Congestive Heart Failure -Discuss the definition of CHF, how to live with CHF, the signs and symptoms of CHF, and how keep track of weight and sodium intake.   Heart Disease and Intimacy -Discus the effect sexual activity has on the heart, how changes occur during intimacy as we age, and safety  during sexual activity.   Smoking Cessation / COPD -Discuss different methods to quit smoking, the health benefits of quitting smoking, and the definition of COPD. Flowsheet Row CARDIAC REHAB PHASE II EXERCISE from 05/17/2023 in [place] CARDIAC REHABILITATION  Date 05/17/23  Educator [facility]  Instruction Review Code 1- Verbalizes Understanding       Nutrition I: Fats -Discuss the types of cholesterol, what cholesterol does to the heart, and how cholesterol levels can be controlled.   Nutrition II: Labels -Discuss the different components of food labels and how to read food label   Heart Parts/Heart Disease and PAD -Discuss the anatomy of the heart, the pathway of blood circulation through the heart, and these are affected by heart disease.   Stress I: Signs and Symptoms -Discuss the causes of stress, how stress may lead to anxiety and depression, and ways to limit stress. Flowsheet Row CARDIAC REHAB PHASE II EXERCISE from 05/17/2023 in [place] CARDIAC REHABILITATION  Date 05/03/23  Educator [facility]       Stress II: Relaxation -Discuss different types of relaxation techniques to limit stress. Flowsheet Row CARDIAC REHAB PHASE II EXERCISE from 05/17/2023 in [place] CARDIAC REHABILITATION  Date 05/03/23  Educator [facility]       Warning Signs of Stroke / TIA -Discuss definition of a stroke, what the signs and symptoms are of a stroke, and how to identify when someone is having stroke.   Knowledge Questionnaire Score:  Knowledge Questionnaire Score - 04/25/23 1417       Knowledge Questionnaire Score   Pre Score 26/28             Core Components/Risk Factors/Patient Goals at Admission:  Personal Goals and Risk Factors at Admission - 04/25/23 1417       Core Components/Risk Factors/Patient Goals on Admission    Weight Management Yes;Weight Loss;Weight Maintenance    Intervention Weight Management: Develop a combined nutrition and exercise program designed to reach  desired caloric intake, while maintaining appropriate intake of nutrient and fiber, sodium and fats, and appropriate energy expenditure required for the weight goal.;Weight Management: Provide education and appropriate resources to help participant work on and attain dietary goals.    Admit Weight 175 lb 1.6 oz (79.4 kg)    Goal Weight: Short Term 170 lb (77.1 kg)    Goal Weight: Long Term 170 lb (77.1 kg)    Expected Outcomes Short Term: Continue to assess and modify interventions until short term weight is achieved;Long Term: Adherence to nutrition and physical activity/exercise program aimed toward attainment of established weight goal;Weight Maintenance: Understanding of the daily nutrition guidelines, which includes 25-35% calories from fat, 7% or less cal from saturated fats, less than 200mg  cholesterol, less than 1.5gm of sodium, & 5 or more servings of fruits and vegetables daily;Weight Loss: Understanding of general recommendations for a balanced deficit meal plan, which promotes 1-2 lb weight loss per week and includes a negative energy balance of [phone] kcal/d;Understanding recommendations for meals to include 15-35% energy  as protein, 25-35% energy from fat, 35-60% energy from carbohydrates, less than 200mg  of dietary cholesterol, 20-35 gm of total fiber daily;Understanding of distribution of calorie intake throughout the day with the consumption of 4-5 meals/snacks    Tobacco Cessation Yes   quit 03/2023   Intervention Offer self-teaching materials, assist with locating and accessing local/national Quit Smoking programs, and support quit date choice.    Expected Outcomes Short Term: Will quit all tobacco product use, adhering to prevention of relapse plan.;Long Term: Complete abstinence from all tobacco products for at least 12 months from quit date.    Hypertension Yes    Intervention Provide education on lifestyle modifcations including regular physical activity/exercise, weight management,  moderate sodium restriction and increased consumption of fresh fruit, vegetables, and low fat dairy, alcohol moderation, and smoking cessation.;Monitor prescription use compliance.    Expected Outcomes Short Term: Continued assessment and intervention until BP is < 140/49mm HG in hypertensive participants. < 130/82mm HG in hypertensive participants with diabetes, heart failure or chronic kidney disease.;Long Term: Maintenance of blood pressure at goal levels.    Lipids Yes    Intervention Provide education and support for participant on nutrition & aerobic/resistive exercise along with prescribed medications to achieve LDL 70mg , HDL >40mg .    Expected Outcomes Short Term: Participant states understanding of desired cholesterol values and is compliant with medications prescribed. Participant is following exercise prescription and nutrition guidelines.;Long Term: Cholesterol controlled with medications as prescribed, with individualized exercise RX and with personalized nutrition plan. Value goals: LDL < 70mg , HDL > 40 mg.             Core Components/Risk Factors/Patient Goals Review:   Goals and Risk Factor Review     Row Name 04/24/23 1548 05/10/23 1353           Core Components/Risk Factors/Patient Goals Review   Personal Goals Review Tobacco Cessation Weight Management/Obesity;Hypertension;Lipids;Tobacco Cessation      Review Kerim has recently quit tobacco use within the last 6 months. Intervention for relapse prevention was provided at the initial medical review. He was encouraged to continue to with tobacco cessation and was provided information on relapse prevention. Patient received information about combination therapy, tobacco cessation classes, quit line, and quit smoking apps in case of a relapse. Patient demonstrated understanding of this material.Staff will continue to provide encouragement and follow up with the patient throughout the program. Pt quit smoking in July of 2024.   He stated that his weight fluctuates but that he has lost a few pounds since he started the program.  He is taking his Metoprolol and Lipitor as prescribed, and he stated that he also recently started an injection every 2 weeks to reduce his bad cholesterol.  Pt stated that his girlfriend checks his blood pressure at home about every other day.      Expected Outcomes Short: Continued cessation Long: Long term cessation and work through relapses as needed Short term:  Continue smoking cessation  Long term:  Continue taking medications as prescribed for blood pressure/cholesterol               Core Components/Risk Factors/Patient Goals at Discharge (Final Review):   Goals and Risk Factor Review - 05/10/23 1353       Core Components/Risk Factors/Patient Goals Review   Personal Goals Review Weight Management/Obesity;Hypertension;Lipids;Tobacco Cessation    Review Pt quit smoking in July of 2024.  He stated that his weight fluctuates but that he has lost a few pounds since he  started the program.  He is taking his Metoprolol and Lipitor as prescribed, and he stated that he also recently started an injection every 2 weeks to reduce his bad cholesterol.  Pt stated that his girlfriend checks his blood pressure at home about every other day.    Expected Outcomes Short term:  Continue smoking cessation  Long term:  Continue taking medications as prescribed for blood pressure/cholesterol             ITP Comments:  ITP Comments     Row Name 04/24/23 1529 04/25/23 1409 05/23/23 1522       ITP Comments Completed virtual orientation today.  EP evaluation is scheduled for 04/25/23 at 1230.  Documentation for diagnosis can be found in [facility] encounter 04/04/23 and OV 04/19/23. Patient attend orientation today.  Patient is attendingCardiac Rehabilitation Program.  Documentation for diagnosis can be found in [facility] 04/04/23.  Reviewed medical chart, RPE/RPD, gym safety, and program guidelines.  Patient was fitted to  equipment they will be using during rehab.  Patient is scheduled to start exercise on 05/01/23 at 1330.   Initial ITP created and sent for review and signature by Dr. Dina Rich, Medical Director for Cardiac Rehabilitation Program. 30 day review completed. ITP sent to Dr. Dina Rich, Medical Director of Cardiac Rehab. Continue with ITP unless changes are made by physician.              Comments: 30 day review

## 2023-05-24 ENCOUNTER — Encounter (HOSPITAL_COMMUNITY)
Admission: RE | Admit: 2023-05-24 | Discharge: 2023-05-24 | Disposition: A | Discharge status: Home / Self Care | Payer: Medicare Other | Source: Ambulatory Visit | Attending: Interventional Cardiology | Admitting: Interventional Cardiology

## 2023-05-24 DIAGNOSIS — I2111 ST elevation (STEMI) myocardial infarction involving right coronary artery: Secondary | ICD-10-CM

## 2023-05-24 DIAGNOSIS — Z955 Presence of coronary angioplasty implant and graft: Secondary | ICD-10-CM

## 2023-05-24 NOTE — Progress Notes (Signed)
Daily Session Note  Patient Details  Name: Dale Fowler MRN: [id number] Date of Birth: [birth year] Referring Provider:   Flowsheet Row CARDIAC REHAB PHASE II ORIENTATION from 04/25/2023 in [facility] CARDIAC REHABILITATION  Referring Provider Aleen Campi MD       Encounter Date: 05/24/2023  Check In:  Session Check In - 05/24/23 1315       Check-In   Supervising physician immediately available to respond to emergencies [facility] MD immediately available    Physician(s) Dr. Diona Browner    Location [facility]    Staff Present Ross Ludwig, BS, Exercise Physiologist;Lesa Vandall BSN, RN;Daphyne Daphine Deutscher, RN, BSN    Virtual Visit No    Medication changes reported     No    Fall or balance concerns reported    No    Tobacco Cessation No Change    Warm-up and Cool-down Performed on first and last piece of equipment    Resistance Training Performed Yes    VAD Patient? No    PAD/SET Patient? No      Pain Assessment   Currently in Pain? No/denies    Pain Score 0-No pain    Multiple Pain Sites No             Capillary Blood Glucose: No results found for this or any previous visit (from the past 24 hour(s)).    Social History   Tobacco Use  Smoking Status Former   Current packs/day: 0.00   Average packs/day: 0.5 packs/day for 18.0 years (9.0 ttl pk-yrs)   Types: Cigarettes   Quit date: 03/2023   Years since quitting: 0.2  Smokeless Tobacco Not on file    Goals Met:  Independence with exercise equipment Exercise tolerated well No report of concerns or symptoms today Strength training completed today  Goals Unmet:  Not Applicable  Comments: Marland KitchenMarland KitchenPt able to follow exercise prescription today without complaint.  Will continue to monitor for progression.    Dr. Dina Rich is Medical Director for [facility] Cardiac Rehab

## 2023-05-29 ENCOUNTER — Encounter (HOSPITAL_COMMUNITY): Payer: Medicare Other

## 2023-05-31 ENCOUNTER — Encounter (HOSPITAL_COMMUNITY)
Admission: RE | Admit: 2023-05-31 | Discharge: 2023-05-31 | Disposition: A | Discharge status: Home / Self Care | Payer: Medicare Other | Source: Ambulatory Visit | Attending: Interventional Cardiology | Admitting: Interventional Cardiology

## 2023-05-31 DIAGNOSIS — I2111 ST elevation (STEMI) myocardial infarction involving right coronary artery: Secondary | ICD-10-CM

## 2023-05-31 DIAGNOSIS — Z955 Presence of coronary angioplasty implant and graft: Secondary | ICD-10-CM

## 2023-05-31 NOTE — Progress Notes (Signed)
Reviewed home exercise with pt today.  Pt plans to walk at park and use weights at home for exercise.  Reviewed THR, pulse, RPE, sign and symptoms, pulse oximetery and when to call 911 or MD.  Also discussed weather considerations and indoor options.  Pt voiced understanding.

## 2023-05-31 NOTE — Progress Notes (Signed)
Daily Session Note  Patient Details  Name: Dale Fowler MRN: [id number] Date of Birth: 10-12-1965 Referring Provider:   Flowsheet Row CARDIAC REHAB PHASE II ORIENTATION from 04/25/2023 in [facility] CARDIAC REHABILITATION  Referring Provider Aleen Campi MD       Encounter Date: 05/31/2023  Check In:  Session Check In - 05/31/23 1330       Check-In   Supervising physician immediately available to respond to emergencies See telemetry face sheet for immediately available MD    Location [facility]    Staff Present Ross Ludwig, BS, Exercise Physiologist;Jessica Juanetta Gosling, MA, RCEP, CCRP, Harolyn Rutherford, RN, BSN    Virtual Visit No    Medication changes reported     No    Fall or balance concerns reported    No    Tobacco Cessation No Change    Warm-up and Cool-down Performed on first and last piece of equipment    Resistance Training Performed Yes    VAD Patient? No      Pain Assessment   Currently in Pain? No/denies             Capillary Blood Glucose: No results found for this or any previous visit (from the past 24 hour(s)).    Social History   Tobacco Use  Smoking Status Former   Current packs/day: 0.00   Average packs/day: 0.5 packs/day for 18.0 years (9.0 ttl pk-yrs)   Types: Cigarettes   Quit date: 03/2023   Years since quitting: 0.2  Smokeless Tobacco Not on file    Goals Met:  Independence with exercise equipment Exercise tolerated well No report of concerns or symptoms today Strength training completed today  Goals Unmet:  Not Applicable  Comments: Pt able to follow exercise prescription today without complaint.  Will continue to monitor for progression.    Dr. Dina Rich is Medical Director for [facility] Cardiac Rehab

## 2023-06-05 ENCOUNTER — Encounter (HOSPITAL_COMMUNITY)
Admission: RE | Admit: 2023-06-05 | Discharge: 2023-06-05 | Disposition: A | Discharge status: Home / Self Care | Payer: Medicare Other | Source: Ambulatory Visit | Attending: Interventional Cardiology

## 2023-06-05 DIAGNOSIS — Z955 Presence of coronary angioplasty implant and graft: Secondary | ICD-10-CM

## 2023-06-05 DIAGNOSIS — I2111 ST elevation (STEMI) myocardial infarction involving right coronary artery: Secondary | ICD-10-CM | POA: Diagnosis not present

## 2023-06-05 NOTE — Progress Notes (Signed)
Daily Session Note  Patient Details  Name: Dale Fowler MRN: [id number] Date of Birth: [birth year] Referring Provider:   Flowsheet Row CARDIAC REHAB PHASE II ORIENTATION from 04/25/2023 in [facility] CARDIAC REHABILITATION  Referring Provider Aleen Campi MD       Encounter Date: 06/05/2023  Check In:  Session Check In - 06/05/23 1330       Check-In   Supervising physician immediately available to respond to emergencies See telemetry face sheet for immediately available MD    Location [facility]    Staff Present Ross Ludwig, BS, Exercise Physiologist;Rhonin Trott, RN;Daphyne Daphine Deutscher, RN, Thomos Lemons, MA, RCEP, CCRP, CCET    Virtual Visit No    Medication changes reported     No    Fall or balance concerns reported    No    Tobacco Cessation No Change    Warm-up and Cool-down Performed on first and last piece of equipment    Resistance Training Performed Yes    VAD Patient? No    PAD/SET Patient? No      Pain Assessment   Currently in Pain? No/denies    Pain Score 0-No pain    Multiple Pain Sites No             Capillary Blood Glucose: No results found for this or any previous visit (from the past 24 hour(s)).    Social History   Tobacco Use  Smoking Status Former   Current packs/day: 0.00   Average packs/day: 0.5 packs/day for 18.0 years (9.0 ttl pk-yrs)   Types: Cigarettes   Quit date: 03/2023   Years since quitting: 0.2  Smokeless Tobacco Not on file    Goals Met:  Independence with exercise equipment Exercise tolerated well No report of concerns or symptoms today Strength training completed today  Goals Unmet:  Not Applicable  Comments: Pt able to follow exercise prescription today without complaint.  Will continue to monitor for progression.    Dr. Dina Rich is Medical Director for [facility] Cardiac Rehab

## 2023-06-07 ENCOUNTER — Encounter (HOSPITAL_COMMUNITY): Payer: Medicare Other

## 2023-06-12 ENCOUNTER — Encounter (HOSPITAL_COMMUNITY)
Admission: RE | Admit: 2023-06-12 | Discharge: 2023-06-12 | Disposition: A | Discharge status: Home / Self Care | Payer: Medicare Other | Source: Ambulatory Visit | Attending: Interventional Cardiology | Admitting: Interventional Cardiology

## 2023-06-12 ENCOUNTER — Encounter (HOSPITAL_COMMUNITY): Payer: Medicare Other

## 2023-06-12 DIAGNOSIS — I252 Old myocardial infarction: Secondary | ICD-10-CM | POA: Diagnosis not present

## 2023-06-12 DIAGNOSIS — I2111 ST elevation (STEMI) myocardial infarction involving right coronary artery: Secondary | ICD-10-CM | POA: Insufficient documentation

## 2023-06-12 DIAGNOSIS — F1290 Cannabis use, unspecified, uncomplicated: Secondary | ICD-10-CM | POA: Diagnosis not present

## 2023-06-12 DIAGNOSIS — E785 Hyperlipidemia, unspecified: Secondary | ICD-10-CM | POA: Diagnosis not present

## 2023-06-12 DIAGNOSIS — I10 Essential (primary) hypertension: Secondary | ICD-10-CM | POA: Diagnosis not present

## 2023-06-12 DIAGNOSIS — Z955 Presence of coronary angioplasty implant and graft: Secondary | ICD-10-CM | POA: Diagnosis not present

## 2023-06-12 DIAGNOSIS — I2510 Atherosclerotic heart disease of native coronary artery without angina pectoris: Secondary | ICD-10-CM | POA: Insufficient documentation

## 2023-06-12 DIAGNOSIS — I739 Peripheral vascular disease, unspecified: Secondary | ICD-10-CM | POA: Insufficient documentation

## 2023-06-14 ENCOUNTER — Encounter: Payer: Medicare Other | Admitting: Nurse Practitioner

## 2023-06-14 ENCOUNTER — Encounter: Payer: Self-pay | Admitting: Nurse Practitioner

## 2023-06-14 ENCOUNTER — Encounter (HOSPITAL_COMMUNITY): Payer: Medicare Other

## 2023-06-14 VITALS — BP 134/80 | HR 76 | Ht 67.0 in | Wt 177.0 lb

## 2023-06-14 DIAGNOSIS — I252 Old myocardial infarction: Secondary | ICD-10-CM

## 2023-06-14 DIAGNOSIS — I2510 Atherosclerotic heart disease of native coronary artery without angina pectoris: Secondary | ICD-10-CM

## 2023-06-14 DIAGNOSIS — I739 Peripheral vascular disease, unspecified: Secondary | ICD-10-CM

## 2023-06-14 DIAGNOSIS — I10 Essential (primary) hypertension: Secondary | ICD-10-CM | POA: Diagnosis not present

## 2023-06-14 DIAGNOSIS — E785 Hyperlipidemia, unspecified: Secondary | ICD-10-CM

## 2023-06-14 DIAGNOSIS — F1290 Cannabis use, unspecified, uncomplicated: Secondary | ICD-10-CM

## 2023-06-14 MED ORDER — METOPROLOL TARTRATE 25 MG PO TABS: 25.0000 mg | ORAL_TABLET | Freq: Two times a day (BID) | ORAL | 1 refills | Status: DC

## 2023-06-14 NOTE — Patient Instructions (Addendum)
Medication Instructions:  Your physician has recommended you make the following change in your medication:  Please increase Metoprolol to 25 Mg Twice daily   Labwork: None  Testing/Procedures: none  Follow-Up: Your physician recommends that you schedule a follow-up appointment in: 6 Months   Any Other Special Instructions Will Be Listed Below (If Applicable).  If you need a refill on your cardiac medications before your next appointment, please call your pharmacy.

## 2023-06-14 NOTE — Progress Notes (Signed)
Cardiology Office Note:  .   Date:  06/14/2023  ID:  Dale Fowler, DOB 01-13-1966, MRN [id number] PCP: Lawerance Sabal, PA  [facility] Providers Cardiologist:  Lance Muss, MD    History of Present Illness: Marland Kitchen   Dale Fowler is a 57 y.o. male with a PMH of CAD, s/p inferior STEMI (03/2023), HTN, HLD, tobacco abuse, peripheral vascular disease, GERD, hx of CVA, idioventricular rhythm, and chronic back pain, who presents today for STEMI/PCI follow-up.   Admitted 03/2023 for inferior STEMI.  Underwent cardiac catheterization that revealed total occlusion of RCA, treated with PCI/DES x 1.  Recommended for DAPT with aspirin/Brilinta for at least 1 year.  Was seen by cardiac rehab.  Echo revealed EF 55 to 60%, hypokinesis of inferior and basal inferior lateral wall, grade 2 DD, normal RV and mild to moderate MR.  Today he presents for follow-up with his significant other.  He states he is doing well. Denies any chest pain, shortness of breath, palpitations, syncope, presyncope, dizziness, orthopnea, PND, swelling or significant weight changes, acute bleeding, or claudication. Tolerating his medications well and compliant with his medications. Doing well at cardiac rehab. SBP averages 120's - 130's at home.   SH: Former smoker. Does occasionally smoke marijuana. Drinks 1/2 can of beer weekly. Denies any other illicit drug use.    Studies Reviewed: .    Echo 03/2023:  1. Left ventricular ejection fraction, by estimation, is 55%. The left  ventricle has normal function. The left ventricle demonstrates regional  wall motion abnormalities with basal inferior and basal inferolateral  severe hypokinesis. Left ventricular  diastolic parameters are consistent with Grade II diastolic dysfunction  (pseudonormalization).   2. Right ventricular systolic function is normal. The right ventricular  size is not well visualized. There is normal pulmonary artery systolic  pressure. The estimated  right ventricular systolic pressure is 21.3 mmHg.   3. The mitral valve is abnormal, there is restriction of the posterior  leaflet that may be related to recent MI with inferior and inferolateral  wall motion abnormalities. Mild to moderate mitral valve regurgitation. No  evidence of mitral stenosis.   4. The aortic valve is tricuspid. There is mild calcification of the  aortic valve. Aortic valve regurgitation is not visualized. No aortic  stenosis is present.   5. The inferior vena cava is normal in size with greater than 50%  respiratory variability, suggesting right atrial pressure of 3 mmHg.  LHC 03/2023:    Prox LAD lesion is 40% stenosed.   Mid LAD lesion is 40% stenosed.   Mid RCA lesion is 99% stenosed.   Prox RCA lesion is 40% stenosed.   1st Mrg lesion is 60% stenosed.   A drug-eluting stent was successfully placed.   Post intervention, there is a 0% residual stenosis.   Post intervention, there is a 0% residual stenosis.   The left ventricular systolic function is normal.   LV end diastolic pressure is mildly elevated.   The left ventricular ejection fraction is 50-55% by visual estimate.   Recommend uninterrupted dual antiplatelet therapy with Aspirin 81mg  daily and Ticagrelor 90mg  twice daily.   Inferior ST segment elevation myocardial infarction secondary to initial total occlusion of a large dominant RCA.   Mild concomitant CAD with 40% ostial LAD followed by 40% proximal stenosis and 20% mid stenosis.  The marginal/ramus like vessel has diffuse 60% mid stenosis.  Mild collateralization to the PDA from septal perforating arteries.   Dominant RCA with superior  takeoff and probable initial total occlusion of the RCA which had opened to 99% following heparinization and visualized on the initial injection.  There also was 40% stenosis proximal to the RV branch.   Reperfusion associated idioventricular rhythm treated with IV Lopressor.   Successful PCI to the RCA with  ultimate insertion of a 3.5 x 20 and 3.5 x 12 mm Synergy stents postdilated to 3.71 mm with the stenoses being reduced to 0% and brisk TIMI-3 flow.   RECOMMENDATION: DAPT with aspirin/Brilinta for minimum of 12 months.  Initiate beta-blocker therapy, and additional anti-ischemic medical therapy for concomitant CAD.  Aggressive lipid-lowering therapy with target LDL less than 55.  Smoking cessation is essential.   Physical Exam:   VS:  BP 134/80 (BP Location: Right Arm)   Pulse 76   Ht 5\' 7"  (1.702 m)   Wt 177 lb (80.3 kg)   SpO2 97%   BMI 27.72 kg/m    Wt Readings from Last 3 Encounters:  06/14/23 177 lb (80.3 kg)  04/25/23 175 lb 1.6 oz (79.4 kg)  04/23/23 178 lb (80.7 kg)    GEN: Well nourished, well developed in no acute distress NECK: No JVD; No carotid bruits CARDIAC: S1/S2, RRR, no murmurs, rubs, gallops RESPIRATORY:  Clear to auscultation without rales, wheezing or rhonchi  ABDOMEN: Soft, non-tender, non-distended EXTREMITIES:  No edema; No deformity  ASSESSMENT AND PLAN: .    CAD, s/p inferior MI and s/p DES to RCA 03/2023 Stable with no anginal symptoms. No indication for ischemic evaluation. Continue Aspirin, Atorvastatin, Brilinta, and NTG PRN. Heart healthy diet and regular cardiovascular exercise encouraged. Continue cardiac rehab. Will increase Lopressor to 25 mg BID for better BP control. Discussed to avoid taking NTG and Cialis within 48 hours of one another, verbalized understanding.   2. HTN BP slightly elevated. Discussed SBP goal < 130. Will increase Lopressor to 15 mg BID. Discussed to monitor BP at home at least 2 hours after medications and sitting for 5-10 minutes. No other medication changes at this time. Heart healthy diet and regular cardiovascular exercise encouraged.   3. HLD Continue atorvastatin and now on Repatha, tolerating well. Future labwork (FLP) has been arranged. Heart healthy diet and regular cardiovascular exercise encouraged.   4. PVD He  reported a hx of claudication during past admission. Denies any symptoms today.  Continue current medication regimen. Follow-up with [facility] as scheduled.   5. Marijuana use Drug use cessation encouraged and discussed.  Dispo: Patient is requesting follow-up in [place]. Follow-up with [facility] MD/[facility] in 6 months or sooner if anything changes.   Signed, Sharlene Dory, NP

## 2023-06-19 ENCOUNTER — Encounter (HOSPITAL_COMMUNITY)
Admission: RE | Admit: 2023-06-19 | Discharge: 2023-06-19 | Disposition: A | Discharge status: Home / Self Care | Payer: Medicare Other | Source: Ambulatory Visit | Attending: Interventional Cardiology | Admitting: Interventional Cardiology

## 2023-06-19 DIAGNOSIS — I739 Peripheral vascular disease, unspecified: Secondary | ICD-10-CM | POA: Diagnosis not present

## 2023-06-19 DIAGNOSIS — I10 Essential (primary) hypertension: Secondary | ICD-10-CM | POA: Diagnosis not present

## 2023-06-19 DIAGNOSIS — Z955 Presence of coronary angioplasty implant and graft: Secondary | ICD-10-CM

## 2023-06-19 DIAGNOSIS — I2111 ST elevation (STEMI) myocardial infarction involving right coronary artery: Secondary | ICD-10-CM

## 2023-06-19 DIAGNOSIS — E785 Hyperlipidemia, unspecified: Secondary | ICD-10-CM | POA: Diagnosis not present

## 2023-06-19 DIAGNOSIS — I2510 Atherosclerotic heart disease of native coronary artery without angina pectoris: Secondary | ICD-10-CM | POA: Diagnosis not present

## 2023-06-19 DIAGNOSIS — F1290 Cannabis use, unspecified, uncomplicated: Secondary | ICD-10-CM | POA: Diagnosis not present

## 2023-06-19 DIAGNOSIS — I252 Old myocardial infarction: Secondary | ICD-10-CM | POA: Diagnosis not present

## 2023-06-19 NOTE — Progress Notes (Signed)
Daily Session Note  Patient Details  Name: Dale Fowler MRN: [id number] Date of Birth: Jul 26, 1966 Referring Provider:   Flowsheet Row CARDIAC REHAB PHASE II ORIENTATION from 04/25/2023 in [facility] CARDIAC REHABILITATION  Referring Provider Aleen Campi MD       Encounter Date: 06/19/2023  Check In:  Session Check In - 06/19/23 1330       Check-In   Supervising physician immediately available to respond to emergencies See telemetry face sheet for immediately available MD    Location [facility]    Staff Present Ross Ludwig, BS, Exercise Physiologist;Kole Hilyard, RN;Daphyne Daphine Deutscher, RN, Thomos Lemons, MA, RCEP, CCRP, CCET    Virtual Visit No    Medication changes reported     No    Fall or balance concerns reported    No    Tobacco Cessation No Change    Warm-up and Cool-down Performed on first and last piece of equipment    Resistance Training Performed Yes    VAD Patient? No    PAD/SET Patient? No      Pain Assessment   Currently in Pain? No/denies    Pain Score 0-No pain    Multiple Pain Sites No             Capillary Blood Glucose: No results found for this or any previous visit (from the past 24 hour(s)).    Social History   Tobacco Use  Smoking Status Former   Current packs/day: 0.00   Average packs/day: 0.5 packs/day for 18.0 years (9.0 ttl pk-yrs)   Types: Cigarettes   Quit date: 03/2023   Years since quitting: 0.2  Smokeless Tobacco Not on file    Goals Met:  Independence with exercise equipment Exercise tolerated well No report of concerns or symptoms today Strength training completed today  Goals Unmet:  Not Applicable  Comments: Pt able to follow exercise prescription today without complaint.  Will continue to monitor for progression.    Dr. Dina Rich is Medical Director for [facility] Cardiac Rehab

## 2023-06-20 ENCOUNTER — Encounter (HOSPITAL_COMMUNITY): Payer: Self-pay | Admitting: *Deleted

## 2023-06-20 DIAGNOSIS — I2111 ST elevation (STEMI) myocardial infarction involving right coronary artery: Secondary | ICD-10-CM

## 2023-06-20 DIAGNOSIS — Z955 Presence of coronary angioplasty implant and graft: Secondary | ICD-10-CM

## 2023-06-20 NOTE — Progress Notes (Signed)
Cardiac Individual Treatment Plan  Patient Details  Name: Dale Fowler MRN: [id number] Date of Birth: 1966/03/31 Referring Provider:   Flowsheet Row CARDIAC REHAB PHASE II ORIENTATION from 04/25/2023 in [facility] CARDIAC REHABILITATION  Referring Provider Aleen Campi MD       Initial Encounter Date:  Flowsheet Row CARDIAC REHAB PHASE II ORIENTATION from 04/25/2023 in [place] CARDIAC REHABILITATION  Date 04/25/23       Visit Diagnosis: ST elevation myocardial infarction involving right coronary artery [facility])  Status post coronary artery stent placement  Patient's Home Medications on Admission:  Current Outpatient Medications:    aspirin 81 MG chewable tablet, Chew 1 tablet (81 mg total) by mouth daily., Disp: 90 tablet, Rfl: 2   atorvastatin (LIPITOR) 80 MG tablet, Take 1 tablet (80 mg total) by mouth daily., Disp: 90 tablet, Rfl: 1   diazepam (VALIUM) 2 MG tablet, Take 2 mg by mouth daily as needed for anxiety., Disp: , Rfl:    Evolocumab (REPATHA SURECLICK) 140 MG/ML SOAJ, Inject 140 mg into the skin every 14 (fourteen) days., Disp: 6 mL, Rfl: 3   metoprolol tartrate (LOPRESSOR) 25 MG tablet, Take 1 tablet (25 mg total) by mouth 2 (two) times daily., Disp: 180 tablet, Rfl: 1   nicotine (NICODERM CQ - DOSED IN MG/24 HOURS) 14 mg/24hr patch, Place 1 patch (14 mg total) onto the skin daily at 6 (six) AM., Disp: 28 patch, Rfl: 0   nitroGLYCERIN (NITROSTAT) 0.4 MG SL tablet, Place 1 tablet (0.4 mg total) under the tongue every 5 (five) minutes as needed., Disp: 25 tablet, Rfl: 2   tadalafil (CIALIS) 5 MG tablet, Take 1 tablet (5 mg total) by mouth daily as needed for erectile dysfunction., Disp: 30 tablet, Rfl: 11   ticagrelor (BRILINTA) 90 MG TABS tablet, Take 1 tablet (90 mg total) by mouth 2 (two) times daily., Disp: 180 tablet, Rfl: 3  Past Medical History: Past Medical History:  Diagnosis Date   GERD (gastroesophageal reflux disease)    Hyperlipidemia    Hypertension     does not see a cardiologist   TIA (transient ischemic attack)    X2    Tobacco Use: Social History   Tobacco Use  Smoking Status Former   Current packs/day: 0.00   Average packs/day: 0.5 packs/day for 18.0 years (9.0 ttl pk-yrs)   Types: Cigarettes   Quit date: 03/2023   Years since quitting: 0.2  Smokeless Tobacco Not on file    Labs: Review Flowsheet       Latest Ref Rng & Units 04/04/2023  Labs for ITP Cardiac and Pulmonary Rehab  Cholestrol 0 - 200 mg/dL 409   LDL (calc) 0 - 99 mg/dL 811   HDL-C >91 mg/dL 38   Trlycerides <478 mg/dL 65   Hemoglobin G9F 4.8 - 5.6 % 5.7   TCO2 22 - 32 mmol/L 25     Details            Capillary Blood Glucose: No results found for: "GLUCAP"   Exercise Target Goals: Exercise Program Goal: Individual exercise prescription set using results from initial 6 min walk test and THRR while considering  patient's activity barriers and safety.   Exercise Prescription Goal: Starting with aerobic activity 30 plus minutes a day, 3 days per week for initial exercise prescription. Provide home exercise prescription and guidelines that participant acknowledges understanding prior to discharge.  Activity Barriers & Risk Stratification:  Activity Barriers & Cardiac Risk Stratification - 04/24/23 1520  Activity Barriers & Cardiac Risk Stratification   Activity Barriers Deconditioning;Muscular Weakness;Other (comment);Back Problems;Joint Problems;Shortness of Breath    Comments Left leg claudication, back pain from previous surgeries, TIA x2 residuals on right side (droop/speech)    Cardiac Risk Stratification High             6 Minute Walk:  6 Minute Walk     Row Name 04/25/23 1410         6 Minute Walk   Phase Initial     Distance 1300 feet     Walk Time 6 minutes     # of Rest Breaks 0     MPH 2.46     METS 3.76     RPE 15     VO2 Peak 13.16     Symptoms Yes (comment)     Comments claudication pain 4/5      Resting HR 71 bpm     Resting BP 126/72     Resting Oxygen Saturation  96 %     Exercise Oxygen Saturation  during 6 min walk 98 %     Max Ex. HR 107 bpm     Max Ex. BP 134/78     2 Minute Post BP 124/60              Oxygen Initial Assessment:   Oxygen Re-Evaluation:   Oxygen Discharge (Final Oxygen Re-Evaluation):   Initial Exercise Prescription:  Initial Exercise Prescription - 04/25/23 1400       Date of Initial Exercise RX and Referring Provider   Date 04/25/23    Referring Provider Aleen Campi MD      Oxygen   Maintain Oxygen Saturation 88% or higher      Treadmill   MPH 2.4    Grade 0    Minutes 15    METs 2.84      REL-XR   Level 3    Speed 50    Minutes 15    METs 2.5      Prescription Details   Frequency (times per week) 2    Duration Progress to 30 minutes of continuous aerobic without signs/symptoms of physical distress      Intensity   THRR 40-80% of Max Heartrate 108-145    Ratings of Perceived Exertion 11-13    Perceived Dyspnea 0-4      Progression   Progression Continue to progress workloads to maintain intensity without signs/symptoms of physical distress.      Resistance Training   Training Prescription Yes    Weight 4 lb    Reps 10-15             Perform Capillary Blood Glucose checks as needed.  Exercise Prescription Changes:   Exercise Prescription Changes     Row Name 04/25/23 1400 05/10/23 1400 05/31/23 1300 06/12/23 1300       Response to Exercise   Blood Pressure (Admit) 126/72 144/84 -- 150/82    Blood Pressure (Exercise) 134/78 132/76 -- --    Blood Pressure (Exit) 124/60 132/74 -- 102/62    Heart Rate (Admit) 71 bpm 81 bpm -- 74 bpm    Heart Rate (Exercise) 107 bpm 105 bpm -- 99 bpm    Heart Rate (Exit) 63 bpm 84 bpm -- 70 bpm    Oxygen Saturation (Admit) 96 % -- -- --    Oxygen Saturation (Exercise) 98 % -- -- --    Rating of Perceived Exertion (Exercise) 15 13 -- 15  Symptoms claudication pain  4/5 -- -- --    Comments walk test results -- -- --    Duration -- Continue with 30 min of aerobic exercise without signs/symptoms of physical distress. -- Continue with 30 min of aerobic exercise without signs/symptoms of physical distress.    Intensity -- THRR unchanged -- THRR unchanged      Progression   Progression -- Continue to progress workloads to maintain intensity without signs/symptoms of physical distress. -- Continue to progress workloads to maintain intensity without signs/symptoms of physical distress.      Resistance Training   Training Prescription -- Yes -- Yes    Weight -- 4 / blue band -- 4 lbs / blue band    Reps -- 10-15 -- 10-15      Treadmill   MPH -- 2 -- 2    Grade -- 0.5 -- 0.5    Minutes -- 15 -- 15    METs -- 2.67 -- 2.67      REL-XR   Level -- 4 -- 4    Speed -- 51 -- 44    Minutes -- 15 -- 15    METs -- 3.7 -- 2.7      Home Exercise Plan   Plans to continue exercise at -- -- Home (comment)  walking Home (comment)    Frequency -- -- Add 3 additional days to program exercise sessions. Add 3 additional days to program exercise sessions.    Initial Home Exercises Provided -- -- 05/31/23 --      Oxygen   Maintain Oxygen Saturation -- 88% or higher -- 88% or higher             Exercise Comments:   Exercise Goals and Review:   Exercise Goals     Row Name 04/25/23 1412             Exercise Goals   Increase Physical Activity Yes       Intervention Provide advice, education, support and counseling about physical activity/exercise needs.;Develop an individualized exercise prescription for aerobic and resistive training based on initial evaluation findings, risk stratification, comorbidities and participant's personal goals.       Expected Outcomes Short Term: Attend rehab on a regular basis to increase amount of physical activity.;Long Term: Add in home exercise to make exercise part of routine and to increase amount of physical  activity.;Long Term: Exercising regularly at least 3-5 days a week.       Increase Strength and Stamina Yes       Intervention Provide advice, education, support and counseling about physical activity/exercise needs.;Develop an individualized exercise prescription for aerobic and resistive training based on initial evaluation findings, risk stratification, comorbidities and participant's personal goals.       Expected Outcomes Short Term: Increase workloads from initial exercise prescription for resistance, speed, and METs.;Short Term: Perform resistance training exercises routinely during rehab and add in resistance training at home;Long Term: Improve cardiorespiratory fitness, muscular endurance and strength as measured by increased METs and functional capacity ( )       Able to understand and use rate of perceived exertion (RPE) scale Yes       Intervention Provide education and explanation on how to use RPE scale       Expected Outcomes Short Term: Able to use RPE daily in rehab to express subjective intensity level;Long Term:  Able to use RPE to guide intensity level when exercising independently       Able to  understand and use Dyspnea scale Yes       Intervention Provide education and explanation on how to use Dyspnea scale       Expected Outcomes Short Term: Able to use Dyspnea scale daily in rehab to express subjective sense of shortness of breath during exertion;Long Term: Able to use Dyspnea scale to guide intensity level when exercising independently       Knowledge and understanding of Target Heart Rate Range (THRR) Yes       Intervention Provide education and explanation of THRR including how the numbers were predicted and where they are located for reference       Expected Outcomes Short Term: Able to state/look up THRR;Long Term: Able to use THRR to govern intensity when exercising independently;Short Term: Able to use daily as guideline for intensity in rehab       Able to check  pulse independently Yes       Intervention Provide education and demonstration on how to check pulse in carotid and radial arteries.;Review the importance of being able to check your own pulse for safety during independent exercise       Expected Outcomes Short Term: Able to explain why pulse checking is important during independent exercise;Long Term: Able to check pulse independently and accurately       Understanding of Exercise Prescription Yes       Intervention Provide education, explanation, and written materials on patient's individual exercise prescription       Expected Outcomes Short Term: Able to explain program exercise prescription;Long Term: Able to explain home exercise prescription to exercise independently       Improve claudication pain toleration; Improve walking ability Yes       Intervention Attend education sessions to aid in risk factor modification and understanding of disease process       Expected Outcomes Short Term: Improve walking distance/time to onset of claudication pain;Long Term: Improve walking ability and toleration to claudication                Exercise Goals Re-Evaluation :  Exercise Goals Re-Evaluation     Row Name 04/25/23 1413 05/10/23 1332 05/15/23 1425 05/31/23 1344 06/13/23 1319     Exercise Goal Re-Evaluation   Exercise Goals Review Able to understand and use rate of perceived exertion (RPE) scale;Able to understand and use Dyspnea scale Increase Physical Activity;Increase Strength and Stamina;Understanding of Exercise Prescription Increase Physical Activity;Able to understand and use rate of perceived exertion (RPE) scale;Understanding of Exercise Prescription;Knowledge and understanding of Target Heart Rate Range (THRR);Able to check pulse independently;Increase Strength and Stamina Increase Physical Activity;Increase Strength and Stamina;Able to understand and use rate of perceived exertion (RPE) scale;Able to understand and use Dyspnea  scale;Knowledge and understanding of Target Heart Rate Range (THRR);Able to check pulse independently;Understanding of Exercise Prescription Increase Physical Activity;Increase Strength and Stamina;Understanding of Exercise Prescription   Comments Reviewed RPE  and dyspnea scale, and program prescription with pt today.  Pt voiced understanding and was given a copy of goals to take home. Pt is on his third exercise session in rehab.  He states that he does feel like he has more endurance since starting the program.  Pt increased to level 4 on the elliptical today, and he thinks he has also increased his speed on the treadmill. Pt is doing well in rehab so far. He is currently exercising at level 4 on the XR and walking a speed of 2 on the treadmill. Will monitor and progress as  able. Dale Fowler is doing well in rehab.  He is walking some at home.  Reviewed home exercise with pt today.  Pt plans to walk at park and use weights at home for exercise.  Reviewed THR, pulse, RPE, sign and symptoms, pulse oximetery and when to call 911 or MD.  Also discussed weather considerations and indoor options.  Pt voiced understanding.  He feels that his stamina is getting better. Dale Fowler is tolerating exericse well. He has been showing up late to class due to work sch. He has not increased his level on the XR in the last two week. He is exercising at an RPE of 15. Will continue to monitor and progress as able.   Expected Outcomes Short: Use RPE daily to regulate intensity.  Long: Follow program prescription Short term:  Go over home exercise routine  Long term:  Continue to come to rehab 2 days a week to improve strength/endurance Short term: continue to exericse at worklaods until stamina has increaseed   long term: continue to attend cardiac rehab Short: Start to add in more exercise at home Long; Continue to improve stamina Short : continue to exercise at an RPE of 11-13 then increase level when able    long term: continue to attend rehab              Discharge Exercise Prescription (Final Exercise Prescription Changes):  Exercise Prescription Changes - 06/12/23 1300       Response to Exercise   Blood Pressure (Admit) 150/82    Blood Pressure (Exit) 102/62    Heart Rate (Admit) 74 bpm    Heart Rate (Exercise) 99 bpm    Heart Rate (Exit) 70 bpm    Rating of Perceived Exertion (Exercise) 15    Duration Continue with 30 min of aerobic exercise without signs/symptoms of physical distress.    Intensity THRR unchanged      Progression   Progression Continue to progress workloads to maintain intensity without signs/symptoms of physical distress.      Resistance Training   Training Prescription Yes    Weight 4 lbs / blue band    Reps 10-15      Treadmill   MPH 2    Grade 0.5    Minutes 15    METs 2.67      REL-XR   Level 4    Speed 44    Minutes 15    METs 2.7      Home Exercise Plan   Plans to continue exercise at Home (comment)    Frequency Add 3 additional days to program exercise sessions.      Oxygen   Maintain Oxygen Saturation 88% or higher             Nutrition:  Target Goals: Understanding of nutrition guidelines, daily intake of sodium 1500mg , cholesterol 200mg , calories 30% from fat and 7% or less from saturated fats, daily to have 5 or more servings of fruits and vegetables.  Biometrics:  Pre Biometrics - 04/25/23 1414       Pre Biometrics   Height 5\' 7"  (1.702 m)    Weight 175 lb 1.6 oz (79.4 kg)    Waist Circumference 34 inches    Hip Circumference 36 inches    Waist to Hip Ratio 0.94 %    BMI (Calculated) 27.42    Grip Strength 35.8 kg    Single Leg Stand 27.6 seconds  Nutrition Therapy Plan and Nutrition Goals:  Nutrition Therapy & Goals - 04/24/23 1547       Intervention Plan   Intervention Prescribe, educate and counsel regarding individualized specific dietary modifications aiming towards targeted core components such as weight, hypertension,  lipid management, diabetes, heart failure and other comorbidities.;Nutrition handout(s) given to patient.    Expected Outcomes Short Term Goal: Understand basic principles of dietary content, such as calories, fat, sodium, cholesterol and nutrients.;Short Term Goal: A plan has been developed with personal nutrition goals set during dietitian appointment.;Long Term Goal: Adherence to prescribed nutrition plan.             Nutrition Assessments:  MEDIFICTS Score Key: >=70 Need to make dietary changes  40-70 Heart Healthy Diet <= 40 Therapeutic Level Cholesterol Diet  Flowsheet Row CARDIAC REHAB PHASE II ORIENTATION from 04/25/2023 in [facility] CARDIAC REHABILITATION  Picture Your Plate Total Score on Admission 58 (P)       Picture Your Plate Scores: <16 Unhealthy dietary pattern with much room for improvement. 41-50 Dietary pattern unlikely to meet recommendations for good health and room for improvement. 51-60 More healthful dietary pattern, with some room for improvement.  >60 Healthy dietary pattern, although there may be some specific behaviors that could be improved.    Nutrition Goals Re-Evaluation:  Nutrition Goals Re-Evaluation     Row Name 05/10/23 1345 05/31/23 1353           Goals   Nutrition Goal Heart healthy diet Short term: Pt will try to limit his sweets to 3-4 days a week Long term: Pt will try to limit his snacking at night      Comment Pt eats2 meals a day and snacks at night. Pt does not eat a lot of red meat, he prefers seafood which is usually baked.  Pt's girlfriend is helping with his healthy nutrition by buying fresh fruit and vegetables.  Pt stated that he does eat sweets every day, and he especially likes cakes.  We discussed trying to limit his sweets to 3-4 days a week and also not snacking as much at night after 7 pm. Dale Fowler is doing well in rehab.  He continues to work on his diet.  He still does not have the best appetite.  He says he will find that  there are days he just does not feel like eating. We talked about following mechainical eating or drinking meal replacement on those days to avoid starvation mode.  He is still eating at night and still eating sweets but he is working on it some.      Expected Outcome Short term:  Pt will try to limit his sweets to 3-4 days a week  Long term:  Pt will try to limit his snacking at night Short: on poor appetite days try meal replacement Long: Continue to back off late night snacking               Nutrition Goals Discharge (Final Nutrition Goals Re-Evaluation):  Nutrition Goals Re-Evaluation - 05/31/23 1353       Goals   Nutrition Goal Short term: Pt will try to limit his sweets to 3-4 days a week Long term: Pt will try to limit his snacking at night    Comment Dale Fowler is doing well in rehab.  He continues to work on his diet.  He still does not have the best appetite.  He says he will find that there are days he just does not feel  like eating. We talked about following mechainical eating or drinking meal replacement on those days to avoid starvation mode.  He is still eating at night and still eating sweets but he is working on it some.    Expected Outcome Short: on poor appetite days try meal replacement Long: Continue to back off late night snacking             Psychosocial: Target Goals: Acknowledge presence or absence of significant depression and/or stress, maximize coping skills, provide positive support system. Participant is able to verbalize types and ability to use techniques and skills needed for reducing stress and depression.  Initial Review & Psychosocial Screening:  Initial Psych Review & Screening - 04/24/23 1523       Initial Review   Current issues with History of Depression;Current Anxiety/Panic;Current Depression;Current Stress Concerns;Current Sleep Concerns    Source of Stress Concerns Chronic Illness;Family    Comments not a good sleeping recently has been more  restless at night, depression worse since heart event, several deaths in the family      Family Dynamics   Good Support System? Yes   girlfriend and kids live with them 14, 20, 25     Barriers   Psychosocial barriers to participate in program The patient should benefit from training in stress management and relaxation.;Psychosocial barriers identified (see note)      Screening Interventions   Interventions Encouraged to exercise;Provide feedback about the scores to participant;To provide support and resources with identified psychosocial needs    Expected Outcomes Short Term goal: Utilizing psychosocial counselor, staff and physician to assist with identification of specific Stressors or current issues interfering with healing process. Setting desired goal for each stressor or current issue identified.;Long Term Goal: Stressors or current issues are controlled or eliminated.;Short Term goal: Identification and review with participant of any Quality of Life or Depression concerns found by scoring the questionnaire.;Long Term goal: The participant improves quality of Life and PHQ9 Scores as seen by post scores and/or verbalization of changes             Quality of Life Scores:  Quality of Life - 04/25/23 1414       Quality of Life   Select Quality of Life      Quality of Life Scores   Health/Function Pre 22.39 %    Socioeconomic Pre 25 %    Psych/Spiritual Pre 30 %    Family Pre 30 %    GLOBAL Pre 25.71 %            Scores of 19 and below usually indicate a poorer quality of life in these areas.  A difference of  2-3 points is a clinically meaningful difference.  A difference of 2-3 points in the total score of the Quality of Life Index has been associated with significant improvement in overall quality of life, self-image, physical symptoms, and general health in studies assessing change in quality of life.  PHQ-9: Review Flowsheet       05/22/2023 04/25/2023  Depression  screen PHQ 2/9  Decreased Interest 2 3  Down, Depressed, Hopeless 1 1  PHQ - 2 Score 3 4  Altered sleeping 3 1  Tired, decreased energy 2 3  Change in appetite 3 3  Feeling bad or failure about yourself  2 2  Trouble concentrating 3 3  Moving slowly or fidgety/restless 1 1  Suicidal thoughts 0 0  PHQ-9 Score 17 17  Difficult doing work/chores Somewhat difficult Somewhat difficult  Details           Interpretation of Total Score  Total Score Depression Severity:  1-4 = Minimal depression, 5-9 = Mild depression, 10-14 = Moderate depression, 15-19 = Moderately severe depression, 20-27 = Severe depression   Psychosocial Evaluation and Intervention:  Psychosocial Evaluation - 04/24/23 1541       Psychosocial Evaluation & Interventions   Comments Tannor is coming into rehab after STEMI and stent.  He is looking forward to rehab to build back his stamina and getting back to his normal.  He and his girlfriend want to get his confidence back and get him back to himself again.  He does have a history of TIA x2 which has left him with some residual weakness on right side and slurred speech with some facial droop, but he does not let this slow him down.  He also has a history of depression, but it seems to have worsened since his heart even along with more anxiety.  They also mentioned that there have been several deaths in the family recently, which has him grieving more.  He lives with his girlfriend and their 3 kids who are all very supportive.  They noted that he has been a lot more restless since his heart event and not sleeping as well.  We talked about how exercise could help and if it doesn't then talking to the doctor about it as they have not brought it up yet.  They were both open to this.  We encouraged his girlfriend to come tomorrow for orientation and to attend education classes as well if she wanted.  His barriers to rehab are his chronic back pain and claudication pain which both  limit his activity.  We talked through how we would work around these during rehab.  He quit smoking after his heart attack and was commended on his success.  So far he is doing well with it, but this could be adding to some of his anxiety too.    Expected Outcomes Short: Attend rehab to build stamina and confidence Long: Continue to work through grieving process    Continue Psychosocial Services  Follow up required by staff             Psychosocial Re-Evaluation:  Psychosocial Re-Evaluation     Row Name 05/10/23 1337 05/31/23 1345           Psychosocial Re-Evaluation   Current issues with Current Sleep Concerns;Current Stress Concerns;History of Depression Current Sleep Concerns;Current Stress Concerns;History of Depression;Current Anxiety/Panic      Comments Pt still has trouble sleeping, and he takes Valium as needed for anxiety/sleep.  He is not interested in trying any OTC or prescription sleep aids at this time. The pt stated that he falls asleep watching tv every night.  He does still have stress related to his recent health problems; however, he has a good support system with his 5 kids and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

## 2023-06-21 ENCOUNTER — Encounter (HOSPITAL_COMMUNITY): Payer: Medicare Other

## 2023-06-21 DIAGNOSIS — F1210 Cannabis abuse, uncomplicated: Secondary | ICD-10-CM | POA: Diagnosis not present

## 2023-06-21 DIAGNOSIS — F331 Major depressive disorder, recurrent, moderate: Secondary | ICD-10-CM | POA: Diagnosis not present

## 2023-06-21 DIAGNOSIS — I10 Essential (primary) hypertension: Secondary | ICD-10-CM | POA: Diagnosis not present

## 2023-06-21 DIAGNOSIS — I249 Acute ischemic heart disease, unspecified: Secondary | ICD-10-CM | POA: Diagnosis not present

## 2023-06-21 DIAGNOSIS — M545 Low back pain, unspecified: Secondary | ICD-10-CM | POA: Diagnosis not present

## 2023-06-26 ENCOUNTER — Encounter (HOSPITAL_COMMUNITY)
Admission: RE | Admit: 2023-06-26 | Discharge: 2023-06-26 | Disposition: A | Discharge status: Home / Self Care | Payer: Medicare Other | Source: Ambulatory Visit | Attending: Interventional Cardiology

## 2023-06-26 DIAGNOSIS — Z955 Presence of coronary angioplasty implant and graft: Secondary | ICD-10-CM | POA: Diagnosis not present

## 2023-06-26 DIAGNOSIS — E785 Hyperlipidemia, unspecified: Secondary | ICD-10-CM | POA: Diagnosis not present

## 2023-06-26 DIAGNOSIS — I252 Old myocardial infarction: Secondary | ICD-10-CM | POA: Diagnosis not present

## 2023-06-26 DIAGNOSIS — I10 Essential (primary) hypertension: Secondary | ICD-10-CM | POA: Diagnosis not present

## 2023-06-26 DIAGNOSIS — I2111 ST elevation (STEMI) myocardial infarction involving right coronary artery: Secondary | ICD-10-CM

## 2023-06-26 DIAGNOSIS — I739 Peripheral vascular disease, unspecified: Secondary | ICD-10-CM | POA: Diagnosis not present

## 2023-06-26 DIAGNOSIS — I2510 Atherosclerotic heart disease of native coronary artery without angina pectoris: Secondary | ICD-10-CM | POA: Diagnosis not present

## 2023-06-26 DIAGNOSIS — F1290 Cannabis use, unspecified, uncomplicated: Secondary | ICD-10-CM | POA: Diagnosis not present

## 2023-06-26 NOTE — Progress Notes (Signed)
Daily Session Note  Patient Details  Name: Dale Fowler MRN: [id number] Date of Birth: March 28, 1966 Referring Provider:   Flowsheet Row CARDIAC REHAB PHASE II ORIENTATION from 04/25/2023 in [facility] C[facility]AC REHABILITATION  Referring Provider Aleen Campi MD       Encounter Date: 06/26/2023  Check In:  Session Check In - 06/26/23 1330       Check-In   Supervising physician immediately available to respond to emergencies See telemetry face sheet for immediately available MD    Location [facility]    Staff Present Ross Ludwig, BS, Exercise Physiologist;Christy Randa Evens, RN, BSN;Other    Virtual Visit No    Medication changes reported     Yes    Comments metroprolol 25 mg    Fall or balance concerns reported    No    Tobacco Cessation No Change    Warm-up and Cool-down Performed on first and last piece of equipment    Resistance Training Performed Yes    VAD Patient? No    PAD/SET Patient? No      Pain Assessment   Currently in Pain? No/denies    Pain Score 0-No pain    Multiple Pain Sites No             Capillary Blood Glucose: No results found for this or any previous visit (from the past 24 hour(s)).    Social History   Tobacco Use  Smoking Status Former   Current packs/day: 0.00   Average packs/day: 0.5 packs/day for 18.0 years (9.0 ttl pk-yrs)   Types: Cigarettes   Quit date: 03/2023   Years since quitting: 0.2  Smokeless Tobacco Not on file    Goals Met:  Independence with exercise equipment Exercise tolerated well No report of concerns or symptoms today Strength training completed today  Goals Unmet:  Not Applicable  Comments: Pt able to follow exercise prescription today without complaint.  Will continue to monitor for progression.

## 2023-06-28 ENCOUNTER — Encounter (HOSPITAL_COMMUNITY)
Admission: RE | Admit: 2023-06-28 | Discharge: 2023-06-28 | Disposition: A | Discharge status: Home / Self Care | Payer: Medicare Other | Source: Ambulatory Visit | Attending: Interventional Cardiology | Admitting: Interventional Cardiology

## 2023-06-28 DIAGNOSIS — Z955 Presence of coronary angioplasty implant and graft: Secondary | ICD-10-CM | POA: Diagnosis not present

## 2023-06-28 DIAGNOSIS — I2111 ST elevation (STEMI) myocardial infarction involving right coronary artery: Secondary | ICD-10-CM | POA: Diagnosis not present

## 2023-06-28 DIAGNOSIS — F1290 Cannabis use, unspecified, uncomplicated: Secondary | ICD-10-CM | POA: Diagnosis not present

## 2023-06-28 DIAGNOSIS — E785 Hyperlipidemia, unspecified: Secondary | ICD-10-CM | POA: Diagnosis not present

## 2023-06-28 DIAGNOSIS — I10 Essential (primary) hypertension: Secondary | ICD-10-CM | POA: Diagnosis not present

## 2023-06-28 DIAGNOSIS — I2510 Atherosclerotic heart disease of native coronary artery without angina pectoris: Secondary | ICD-10-CM | POA: Diagnosis not present

## 2023-06-28 DIAGNOSIS — I252 Old myocardial infarction: Secondary | ICD-10-CM | POA: Diagnosis not present

## 2023-06-28 DIAGNOSIS — I739 Peripheral vascular disease, unspecified: Secondary | ICD-10-CM | POA: Diagnosis not present

## 2023-06-28 NOTE — Progress Notes (Signed)
Daily Session Note  Patient Details  Name: Dale Fowler MRN: [id number] Date of Birth: [birth year] Referring Provider:   Flowsheet Row CARDIAC REHAB PHASE II ORIENTATION from 04/25/2023 in [facility] CARDIAC REHABILITATION  Referring Provider Aleen Campi MD       Encounter Date: 06/28/2023  Check In:  Session Check In - 06/28/23 1315       Check-In   Supervising physician immediately available to respond to emergencies See telemetry face sheet for immediately available MD    Location [facility]    Staff Present Avanell Shackleton BSN, RN;Heather Fredric Mare, [place], Exercise Physiologist;Other    Virtual Visit No    Medication changes reported     No    Fall or balance concerns reported    No    Tobacco Cessation Use Decreased    Warm-up and Cool-down Performed on first and last piece of equipment    Resistance Training Performed Yes    VAD Patient? No    PAD/SET Patient? No      Pain Assessment   Currently in Pain? No/denies    Pain Score 0-No pain    Multiple Pain Sites No             Capillary Blood Glucose: No results found for this or any previous visit (from the past 24 hour(s)).    Social History   Tobacco Use  Smoking Status Former   Current packs/day: 0.00   Average packs/day: 0.5 packs/day for 18.0 years (9.0 ttl pk-yrs)   Types: Cigarettes   Quit date: 03/2023   Years since quitting: 0.2  Smokeless Tobacco Not on file    Goals Met:  Proper associated with RPD/PD & O2 Sat Independence with exercise equipment Using PLB without cueing & demonstrates good technique Exercise tolerated well Queuing for purse lip breathing No report of concerns or symptoms today Strength training completed today  Goals Unmet:  Not Applicable  Comments: Marland KitchenMarland KitchenPt able to follow exercise prescription today without complaint.  Will continue to monitor for progression.

## 2023-07-03 ENCOUNTER — Encounter (HOSPITAL_COMMUNITY): Payer: Medicare Other

## 2023-07-05 ENCOUNTER — Encounter (HOSPITAL_COMMUNITY): Payer: Medicare Other

## 2023-07-05 ENCOUNTER — Other Ambulatory Visit (HOSPITAL_COMMUNITY): Payer: Self-pay

## 2023-07-07 DIAGNOSIS — M25561 Pain in right knee: Secondary | ICD-10-CM | POA: Diagnosis not present

## 2023-07-07 DIAGNOSIS — Z6826 Body mass index (BMI) 26.0-26.9, adult: Secondary | ICD-10-CM | POA: Diagnosis not present

## 2023-07-10 ENCOUNTER — Encounter (HOSPITAL_COMMUNITY): Payer: Medicare Other

## 2023-07-11 DIAGNOSIS — M25561 Pain in right knee: Secondary | ICD-10-CM | POA: Diagnosis not present

## 2023-07-12 ENCOUNTER — Encounter (HOSPITAL_COMMUNITY): Payer: Medicare Other

## 2023-07-13 DIAGNOSIS — E785 Hyperlipidemia, unspecified: Secondary | ICD-10-CM | POA: Diagnosis not present

## 2023-07-14 LAB — LIPID PANEL
Chol/HDL Ratio: 1.6 {ratio} (ref 0.0–5.0)
Cholesterol, Total: 65 mg/dL — ABNORMAL LOW (ref 100–199)
HDL: 40 mg/dL (ref >39)
LDL Chol Calc (NIH): 12 mg/dL (ref 0–99)
Triglycerides: 49 mg/dL (ref 0–149)
VLDL Cholesterol Cal: 13 mg/dL (ref 5–40)

## 2023-07-17 ENCOUNTER — Encounter (HOSPITAL_COMMUNITY): Payer: Medicare Other

## 2023-07-18 ENCOUNTER — Encounter (HOSPITAL_COMMUNITY): Payer: Self-pay | Admitting: *Deleted

## 2023-07-18 DIAGNOSIS — I2111 ST elevation (STEMI) myocardial infarction involving right coronary artery: Secondary | ICD-10-CM

## 2023-07-18 DIAGNOSIS — Z955 Presence of coronary angioplasty implant and graft: Secondary | ICD-10-CM

## 2023-07-18 NOTE — Progress Notes (Signed)
Cardiac Individual Treatment Plan  Patient Details  Name: Dale Fowler MRN: [id number] Date of Birth: Dec 31, 1965 Referring Provider:   Flowsheet Row CARDIAC REHAB PHASE II ORIENTATION from 04/25/2023 in [facility] CARDIAC REHABILITATION  Referring Provider Aleen Campi MD       Initial Encounter Date:  Flowsheet Row CARDIAC REHAB PHASE II ORIENTATION from 04/25/2023 in [place] CARDIAC REHABILITATION  Date 04/25/23       Visit Diagnosis: ST elevation myocardial infarction involving right coronary artery [facility])  Status post coronary artery stent placement  Patient's Home Medications on Admission:  Current Outpatient Medications:    aspirin 81 MG chewable tablet, Chew 1 tablet (81 mg total) by mouth daily., Disp: 90 tablet, Rfl: 2   atorvastatin (LIPITOR) 80 MG tablet, Take 1 tablet (80 mg total) by mouth daily., Disp: 90 tablet, Rfl: 1   diazepam (VALIUM) 2 MG tablet, Take 2 mg by mouth daily as needed for anxiety., Disp: , Rfl:    Evolocumab (REPATHA SURECLICK) 140 MG/ML SOAJ, Inject 140 mg into the skin every 14 (fourteen) days., Disp: 6 mL, Rfl: 3   metoprolol tartrate (LOPRESSOR) 25 MG tablet, Take 1 tablet (25 mg total) by mouth 2 (two) times daily., Disp: 180 tablet, Rfl: 1   nicotine (NICODERM CQ - DOSED IN MG/24 HOURS) 14 mg/24hr patch, Place 1 patch (14 mg total) onto the skin daily at 6 (six) AM., Disp: 28 patch, Rfl: 0   nitroGLYCERIN (NITROSTAT) 0.4 MG SL tablet, Place 1 tablet (0.4 mg total) under the tongue every 5 (five) minutes as needed., Disp: 25 tablet, Rfl: 2   tadalafil (CIALIS) 5 MG tablet, Take 1 tablet (5 mg total) by mouth daily as needed for erectile dysfunction., Disp: 30 tablet, Rfl: 11   ticagrelor (BRILINTA) 90 MG TABS tablet, Take 1 tablet (90 mg total) by mouth 2 (two) times daily., Disp: 180 tablet, Rfl: 3  Past Medical History: Past Medical History:  Diagnosis Date   GERD (gastroesophageal reflux disease)    Hyperlipidemia    Hypertension     does not see a cardiologist   TIA (transient ischemic attack)    X2    Tobacco Use: Social History   Tobacco Use  Smoking Status Former   Current packs/day: 0.00   Average packs/day: 0.5 packs/day for 18.0 years (9.0 ttl pk-yrs)   Types: Cigarettes   Quit date: 03/2023   Years since quitting: 0.3  Smokeless Tobacco Not on file    Labs: Review Flowsheet       Latest Ref Rng & Units 04/04/2023 07/13/2023  Labs for ITP Cardiac and Pulmonary Rehab  Cholestrol 100 - 199 mg/dL 119  65   LDL (calc) 0 - 99 mg/dL 147  12   HDL-C >82 mg/dL 38  40   Trlycerides 0 - 149 mg/dL 65  49   Hemoglobin N5A 4.8 - 5.6 % 5.7  -  TCO2 22 - 32 mmol/L 25  -    Details            Capillary Blood Glucose: No results found for: "GLUCAP"   Exercise Target Goals: Exercise Program Goal: Individual exercise prescription set using results from initial 6 min walk test and THRR while considering  patient's activity barriers and safety.   Exercise Prescription Goal: Starting with aerobic activity 30 plus minutes a day, 3 days per week for initial exercise prescription. Provide home exercise prescription and guidelines that participant acknowledges understanding prior to discharge.  Activity Barriers & Risk Stratification:  Activity Barriers & Cardiac Risk Stratification - 04/24/23 1520       Activity Barriers & Cardiac Risk Stratification   Activity Barriers Deconditioning;Muscular Weakness;Other (comment);Back Problems;Joint Problems;Shortness of Breath    Comments Left leg claudication, back pain from previous surgeries, TIA x2 residuals on right side (droop/speech)    Cardiac Risk Stratification High             6 Minute Walk:  6 Minute Walk     Row Name 04/25/23 1410         6 Minute Walk   Phase Initial     Distance 1300 feet     Walk Time 6 minutes     # of Rest Breaks 0     MPH 2.46     METS 3.76     RPE 15     VO2 Peak 13.16     Symptoms Yes (comment)      Comments claudication pain 4/5     Resting HR 71 bpm     Resting BP 126/72     Resting Oxygen Saturation  96 %     Exercise Oxygen Saturation  during 6 min walk 98 %     Max Ex. HR 107 bpm     Max Ex. BP 134/78     2 Minute Post BP 124/60              Oxygen Initial Assessment:   Oxygen Re-Evaluation:   Oxygen Discharge (Final Oxygen Re-Evaluation):   Initial Exercise Prescription:  Initial Exercise Prescription - 04/25/23 1400       Date of Initial Exercise RX and Referring Provider   Date 04/25/23    Referring Provider Aleen Campi MD      Oxygen   Maintain Oxygen Saturation 88% or higher      Treadmill   MPH 2.4    Grade 0    Minutes 15    METs 2.84      REL-XR   Level 3    Speed 50    Minutes 15    METs 2.5      Prescription Details   Frequency (times per week) 2    Duration Progress to 30 minutes of continuous aerobic without signs/symptoms of physical distress      Intensity   THRR 40-80% of Max Heartrate 108-145    Ratings of Perceived Exertion 11-13    Perceived Dyspnea 0-4      Progression   Progression Continue to progress workloads to maintain intensity without signs/symptoms of physical distress.      Resistance Training   Training Prescription Yes    Weight 4 lb    Reps 10-15             Perform Capillary Blood Glucose checks as needed.  Exercise Prescription Changes:   Exercise Prescription Changes     Row Name 04/25/23 1400 05/10/23 1400 05/31/23 1300 06/12/23 1300 06/28/23 1500     Response to Exercise   Blood Pressure (Admit) 126/72 144/84 -- 150/82 128/84   Blood Pressure (Exercise) 134/78 132/76 -- -- --   Blood Pressure (Exit) 124/60 132/74 -- 102/62 120/70   Heart Rate (Admit) 71 bpm 81 bpm -- 74 bpm 66 bpm   Heart Rate (Exercise) 107 bpm 105 bpm -- 99 bpm 94 bpm   Heart Rate (Exit) 63 bpm 84 bpm -- 70 bpm 68 bpm   Oxygen Saturation (Admit) 96 % -- -- -- --   Oxygen Saturation (Exercise)  98 % -- -- -- --    Rating of Perceived Exertion (Exercise) 15 13 -- 15 12   Symptoms claudication pain 4/5 -- -- -- --   Comments walk test results -- -- -- --   Duration -- Continue with 30 min of aerobic exercise without signs/symptoms of physical distress. -- Continue with 30 min of aerobic exercise without signs/symptoms of physical distress. Continue with 30 min of aerobic exercise without signs/symptoms of physical distress.   Intensity -- THRR unchanged -- THRR unchanged THRR unchanged     Progression   Progression -- Continue to progress workloads to maintain intensity without signs/symptoms of physical distress. -- Continue to progress workloads to maintain intensity without signs/symptoms of physical distress. Continue to progress workloads to maintain intensity without signs/symptoms of physical distress.     Resistance Training   Training Prescription -- Yes -- Yes Yes   Weight -- 4 / blue band -- 4 lbs / blue band 4 lbs / blue band   Reps -- 10-15 -- 10-15 10-15     Treadmill   MPH -- 2 -- 2 2   Grade -- 0.5 -- 0.5 0   Minutes -- 15 -- 15 15   METs -- 2.67 -- 2.67 2.53     REL-XR   Level -- 4 -- 4 4   Speed -- 51 -- 44 49   Minutes -- 15 -- 15 15   METs -- 3.7 -- 2.7 3.5     Home Exercise Plan   Plans to continue exercise at -- -- Home (comment)  walking Home (comment) Home (comment)   Frequency -- -- Add 3 additional days to program exercise sessions. Add 3 additional days to program exercise sessions. Add 3 additional days to program exercise sessions.   Initial Home Exercises Provided -- -- 05/31/23 -- --     Oxygen   Maintain Oxygen Saturation -- 88% or higher -- 88% or higher 88% or higher            Exercise Comments:   Exercise Goals and Review:   Exercise Goals     Row Name 04/25/23 1412             Exercise Goals   Increase Physical Activity Yes       Intervention Provide advice, education, support and counseling about physical activity/exercise  needs.;Develop an individualized exercise prescription for aerobic and resistive training based on initial evaluation findings, risk stratification, comorbidities and participant's personal goals.       Expected Outcomes Short Term: Attend rehab on a regular basis to increase amount of physical activity.;Long Term: Add in home exercise to make exercise part of routine and to increase amount of physical activity.;Long Term: Exercising regularly at least 3-5 days a week.       Increase Strength and Stamina Yes       Intervention Provide advice, education, support and counseling about physical activity/exercise needs.;Develop an individualized exercise prescription for aerobic and resistive training based on initial evaluation findings, risk stratification, comorbidities and participant's personal goals.       Expected Outcomes Short Term: Increase workloads from initial exercise prescription for resistance, speed, and METs.;Short Term: Perform resistance training exercises routinely during rehab and add in resistance training at home;Long Term: Improve cardiorespiratory fitness, muscular endurance and strength as measured by increased METs and functional capacity ( )       Able to understand and use rate of perceived exertion (RPE) scale Yes  Intervention Provide education and explanation on how to use RPE scale       Expected Outcomes Short Term: Able to use RPE daily in rehab to express subjective intensity level;Long Term:  Able to use RPE to guide intensity level when exercising independently       Able to understand and use Dyspnea scale Yes       Intervention Provide education and explanation on how to use Dyspnea scale       Expected Outcomes Short Term: Able to use Dyspnea scale daily in rehab to express subjective sense of shortness of breath during exertion;Long Term: Able to use Dyspnea scale to guide intensity level when exercising independently       Knowledge and understanding of  Target Heart Rate Range (THRR) Yes       Intervention Provide education and explanation of THRR including how the numbers were predicted and where they are located for reference       Expected Outcomes Short Term: Able to state/look up THRR;Long Term: Able to use THRR to govern intensity when exercising independently;Short Term: Able to use daily as guideline for intensity in rehab       Able to check pulse independently Yes       Intervention Provide education and demonstration on how to check pulse in carotid and radial arteries.;Review the importance of being able to check your own pulse for safety during independent exercise       Expected Outcomes Short Term: Able to explain why pulse checking is important during independent exercise;Long Term: Able to check pulse independently and accurately       Understanding of Exercise Prescription Yes       Intervention Provide education, explanation, and written materials on patient's individual exercise prescription       Expected Outcomes Short Term: Able to explain program exercise prescription;Long Term: Able to explain home exercise prescription to exercise independently       Improve claudication pain toleration; Improve walking ability Yes       Intervention Attend education sessions to aid in risk factor modification and understanding of disease process       Expected Outcomes Short Term: Improve walking distance/time to onset of claudication pain;Long Term: Improve walking ability and toleration to claudication                Exercise Goals Re-Evaluation :  Exercise Goals Re-Evaluation     Row Name 04/25/23 1413 05/10/23 1332 05/15/23 1425 05/31/23 1344 06/13/23 1319     Exercise Goal Re-Evaluation   Exercise Goals Review Able to understand and use rate of perceived exertion (RPE) scale;Able to understand and use Dyspnea scale Increase Physical Activity;Increase Strength and Stamina;Understanding of Exercise Prescription Increase  Physical Activity;Able to understand and use rate of perceived exertion (RPE) scale;Understanding of Exercise Prescription;Knowledge and understanding of Target Heart Rate Range (THRR);Able to check pulse independently;Increase Strength and Stamina Increase Physical Activity;Increase Strength and Stamina;Able to understand and use rate of perceived exertion (RPE) scale;Able to understand and use Dyspnea scale;Knowledge and understanding of Target Heart Rate Range (THRR);Able to check pulse independently;Understanding of Exercise Prescription Increase Physical Activity;Increase Strength and Stamina;Understanding of Exercise Prescription   Comments Reviewed RPE  and dyspnea scale, and program prescription with pt today.  Pt voiced understanding and was given a copy of goals to take home. Pt is on his third exercise session in rehab.  He states that he does feel like he has more endurance since starting the program.  Pt increased to level 4 on the elliptical today, and he thinks he has also increased his speed on the treadmill. Pt is doing well in rehab so far. He is currently exercising at level 4 on the XR and walking a speed of 2 on the treadmill. Will monitor and progress as able. Dale Fowler is doing well in rehab.  He is walking some at home.  Reviewed home exercise with pt today.  Pt plans to walk at park and use weights at home for exercise.  Reviewed THR, pulse, RPE, sign and symptoms, pulse oximetery and when to call 911 or MD.  Also discussed weather considerations and indoor options.  Pt voiced understanding.  He feels that his stamina is getting better. Dale Fowler is tolerating exericse well. He has been showing up late to class due to work sch. He has not increased his level on the XR in the last two week. He is exercising at an RPE of 15. Will continue to monitor and progress as able.   Expected Outcomes Short: Use RPE daily to regulate intensity.  Long: Follow program prescription Short term:  Go over home exercise  routine  Long term:  Continue to come to rehab 2 days a week to improve strength/endurance Short term: continue to exericse at worklaods until stamina has increaseed   long term: continue to attend cardiac rehab Short: Start to add in more exercise at home Long; Continue to improve stamina Short : continue to exercise at an RPE of 11-13 then increase level when able    long term: continue to attend rehab             Discharge Exercise Prescription (Final Exercise Prescription Changes):  Exercise Prescription Changes - 06/28/23 1500       Response to Exercise   Blood Pressure (Admit) 128/84    Blood Pressure (Exit) 120/70    Heart Rate (Admit) 66 bpm    Heart Rate (Exercise) 94 bpm    Heart Rate (Exit) 68 bpm    Rating of Perceived Exertion (Exercise) 12    Duration Continue with 30 min of aerobic exercise without signs/symptoms of physical distress.    Intensity THRR unchanged      Progression   Progression Continue to progress workloads to maintain intensity without signs/symptoms of physical distress.      Resistance Training   Training Prescription Yes    Weight 4 lbs / blue band    Reps 10-15      Treadmill   MPH 2    Grade 0    Minutes 15    METs 2.53      REL-XR   Level 4    Speed 49    Minutes 15    METs 3.5      Home Exercise Plan   Plans to continue exercise at Home (comment)    Frequency Add 3 additional days to program exercise sessions.      Oxygen   Maintain Oxygen Saturation 88% or higher             Nutrition:  Target Goals: Understanding of nutrition guidelines, daily intake of sodium 1500mg , cholesterol 200mg , calories 30% from fat and 7% or less from saturated fats, daily to have 5 or more servings of fruits and vegetables.  Biometrics:  Pre Biometrics - 04/25/23 1414       Pre Biometrics   Height 5\' 7"  (1.702 m)    Weight 175 lb 1.6 oz (79.4 kg)  Waist Circumference 34 inches    Hip Circumference 36 inches    Waist to Hip  Ratio 0.94 %    BMI (Calculated) 27.42    Grip Strength 35.8 kg    Single Leg Stand 27.6 seconds              Nutrition Therapy Plan and Nutrition Goals:  Nutrition Therapy & Goals - 04/24/23 1547       Intervention Plan   Intervention Prescribe, educate and counsel regarding individualized specific dietary modifications aiming towards targeted core components such as weight, hypertension, lipid management, diabetes, heart failure and other comorbidities.;Nutrition handout(s) given to patient.    Expected Outcomes Short Term Goal: Understand basic principles of dietary content, such as calories, fat, sodium, cholesterol and nutrients.;Short Term Goal: A plan has been developed with personal nutrition goals set during dietitian appointment.;Long Term Goal: Adherence to prescribed nutrition plan.             Nutrition Assessments:  MEDIFICTS Score Key: >=70 Need to make dietary changes  40-70 Heart Healthy Diet <= 40 Therapeutic Level Cholesterol Diet  Flowsheet Row CARDIAC REHAB PHASE II ORIENTATION from 04/25/2023 in [facility] CARDIAC REHABILITATION  Picture Your Plate Total Score on Admission 58 (P)       Picture Your Plate Scores: <63 Unhealthy dietary pattern with much room for improvement. 41-50 Dietary pattern unlikely to meet recommendations for good health and room for improvement. 51-60 More healthful dietary pattern, with some room for improvement.  >60 Healthy dietary pattern, although there may be some specific behaviors that could be improved.    Nutrition Goals Re-Evaluation:  Nutrition Goals Re-Evaluation     Row Name 05/10/23 1345 05/31/23 1353           Goals   Nutrition Goal Heart healthy diet Short term: Pt will try to limit his sweets to 3-4 days a week Long term: Pt will try to limit his snacking at night      Comment Pt eats2 meals a day and snacks at night. Pt does not eat a lot of red meat, he prefers seafood which is usually baked.  Pt's  girlfriend is helping with his healthy nutrition by buying fresh fruit and vegetables.  Pt stated that he does eat sweets every day, and he especially likes cakes.  We discussed trying to limit his sweets to 3-4 days a week and also not snacking as much at night after 7 pm. Dale Fowler is doing well in rehab.  He continues to work on his diet.  He still does not have the best appetite.  He says he will find that there are days he just does not feel like eating. We talked about following mechainical eating or drinking meal replacement on those days to avoid starvation mode.  He is still eating at night and still eating sweets but he is working on it some.      Expected Outcome Short term:  Pt will try to limit his sweets to 3-4 days a week  Long term:  Pt will try to limit his snacking at night Short: on poor appetite days try meal replacement Long: Continue to back off late night snacking               Nutrition Goals Discharge (Final Nutrition Goals Re-Evaluation):  Nutrition Goals Re-Evaluation - 05/31/23 1353       Goals   Nutrition Goal Short term: Pt will try to limit his sweets to 3-4 days a  week Long term: Pt will try to limit his snacking at night    Comment Dale Fowler is doing well in rehab.  He continues to work on his diet.  He still does not have the best appetite.  He says he will find that there are days he just does not feel like eating. We talked about following mechainical eating or drinking meal replacement on those days to avoid starvation mode.  He is still eating at night and still eating sweets but he is working on it some.    Expected Outcome Short: on poor appetite days try meal replacement Long: Continue to back off late night snacking             Psychosocial: Target Goals: Acknowledge presence or absence of significant depression and/or stress, maximize coping skills, provide positive support system. Participant is able to verbalize types and ability to use techniques and skills  needed for reducing stress and depression.  Initial Review & Psychosocial Screening:  Initial Psych Review & Screening - 04/24/23 1523       Initial Review   Current issues with History of Depression;Current Anxiety/Panic;Current Depression;Current Stress Concerns;Current Sleep Concerns    Source of Stress Concerns Chronic Illness;Family    Comments not a good sleeping recently has been more restless at night, depression worse since heart event, several deaths in the family      Family Dynamics   Good Support System? Yes   girlfriend and kids live with them 14, 20, 25     Barriers   Psychosocial barriers to participate in program The patient should benefit from training in stress management and relaxation.;Psychosocial barriers identified (see note)      Screening Interventions   Interventions Encouraged to exercise;Provide feedback about the scores to participant;To provide support and resources with identified psychosocial needs    Expected Outcomes Short Term goal: Utilizing psychosocial counselor, staff and physician to assist with identification of specific Stressors or current issues interfering with healing process. Setting desired goal for each stressor or current issue identified.;Long Term Goal: Stressors or current issues are controlled or eliminated.;Short Term goal: Identification and review with participant of any Quality of Life or Depression concerns found by scoring the questionnaire.;Long Term goal: The participant improves quality of Life and PHQ9 Scores as seen by post scores and/or verbalization of changes             Quality of Life Scores:  Quality of Life - 04/25/23 1414       Quality of Life   Select Quality of Life      Quality of Life Scores   Health/Function Pre 22.39 %    Socioeconomic Pre 25 %    Psych/Spiritual Pre 30 %    Family Pre 30 %    GLOBAL Pre 25.71 %            Scores of 19 and below usually indicate a poorer quality of life in  these areas.  A difference of  2-3 points is a clinically meaningful difference.  A difference of 2-3 points in the total score of the Quality of Life Index has been associated with significant improvement in overall quality of life, self-image, physical symptoms, and general health in studies assessing change in quality of life.  PHQ-9: Review Flowsheet       05/22/2023 04/25/2023  Depression screen PHQ 2/9  Decreased Interest 2 3  Down, Depressed, Hopeless 1 1  PHQ - 2 Score 3 4  Altered sleeping  3 1  Tired, decreased energy 2 3  Change in appetite 3 3  Feeling bad or failure about yourself  2 2  Trouble concentrating 3 3  Moving slowly or fidgety/restless 1 1  Suicidal thoughts 0 0  PHQ-9 Score 17 17  Difficult doing work/chores Somewhat difficult Somewhat difficult    Details           Interpretation of Total Score  Total Score Depression Severity:  1-4 = Minimal depression, 5-9 = Mild depression, 10-14 = Moderate depression, 15-19 = Moderately severe depression, 20-27 = Severe depression   Psychosocial Evaluation and Intervention:  Psychosocial Evaluation - 04/24/23 1541       Psychosocial Evaluation & Interventions   Comments Dale Fowler is coming into rehab after STEMI and stent.  He is looking forward to rehab to build back his stamina and getting back to his normal.  He and his girlfriend want to get his confidence back and get him back to himself again.  He does have a history of TIA x2 which has left him with some residual weakness on right side and slurred speech with some facial droop, but he does not let this slow him down.  He also has a history of depression, but it seems to have worsened since his heart even along with more anxiety.  They also mentioned that there have been several deaths in the family recently, which has him grieving more.  He lives with his girlfriend and their 3 kids who are all very supportive.  They noted that he has been a lot more restless since  his heart event and not sleeping as well.  We talked about how exercise could help and if it doesn't then talking to the doctor about it as they have not brought it up yet.  They were both open to this.  We encouraged his girlfriend to come tomorrow for orientation and to attend education classes as well if she wanted.  His barriers to rehab are his chronic back pain and claudication pain which both limit his activity.  We talked through how we would work around these during rehab.  He quit smoking after his heart attack and was commended on his success.  So far he is doing well with it, but this could be adding to some of his anxiety too.    Expected Outcomes Short: Attend rehab to build stamina and confidence Long: Continue to work through grieving process    Continue Psychosocial Services  Follow up required by staff             Psychosocial Re-Evaluation:  Psychosocial Re-Evaluation     Row Name 05/10/23 1337 05/31/23 1345           Psychosocial Re-Evaluation   Current issues with Current Sleep Concerns;Current Stress Concerns;History of Depression Current Sleep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

## 2023-07-19 ENCOUNTER — Encounter (HOSPITAL_COMMUNITY): Payer: Medicare Other

## 2023-07-23 ENCOUNTER — Telehealth: Payer: Self-pay | Admitting: Pharmacist

## 2023-07-23 MED ORDER — ATORVASTATIN CALCIUM 40 MG PO TABS: 40.0000 mg | ORAL_TABLET | Freq: Every day | ORAL | 3 refills | Status: DC

## 2023-07-23 NOTE — Telephone Encounter (Signed)
Spoke to patient, discussed LDLc and TG level - excellent  Advised to continue on Repatha and lower Lipitor dose from 80 mg daily to 40 mg daily. Prescription for Lipitor 40 mg sent to pharmacy

## 2023-07-23 NOTE — Telephone Encounter (Signed)
  Tinnelle calling, she said, they saw pt's lipid result on mychart and his cholesterol is low. They would like to now if the pt still need to take his repatha

## 2023-07-24 ENCOUNTER — Encounter (HOSPITAL_COMMUNITY): Payer: Medicare Other

## 2023-07-26 ENCOUNTER — Encounter (HOSPITAL_COMMUNITY): Payer: Medicare Other

## 2023-07-31 ENCOUNTER — Encounter (HOSPITAL_COMMUNITY): Payer: Medicare Other

## 2023-08-02 ENCOUNTER — Encounter (HOSPITAL_COMMUNITY): Payer: Medicare Other

## 2023-08-07 ENCOUNTER — Encounter (HOSPITAL_COMMUNITY): Payer: Medicare Other

## 2023-08-14 ENCOUNTER — Telehealth (HOSPITAL_COMMUNITY): Payer: Self-pay | Admitting: *Deleted

## 2023-08-14 ENCOUNTER — Encounter (HOSPITAL_COMMUNITY): Payer: Medicare Other

## 2023-08-14 NOTE — Telephone Encounter (Signed)
Called patient, not available to talk, sleeping.  Having knee pain while exercising.  Instructed to call back about continuing Cardiac Rehab program.

## 2023-08-15 ENCOUNTER — Encounter (HOSPITAL_COMMUNITY): Payer: Self-pay | Admitting: *Deleted

## 2023-08-15 DIAGNOSIS — I2111 ST elevation (STEMI) myocardial infarction involving right coronary artery: Secondary | ICD-10-CM

## 2023-08-15 DIAGNOSIS — Z955 Presence of coronary angioplasty implant and graft: Secondary | ICD-10-CM

## 2023-08-15 NOTE — Progress Notes (Signed)
Cardiac Individual Treatment Plan  Patient Details  Name: Dale Fowler MRN: [id number] Date of Birth: [birth year] Referring Provider:   Flowsheet Row CARDIAC REHAB PHASE II ORIENTATION from 04/25/2023 in [facility] CARDIAC REHABILITATION  Referring Provider Aleen Campi MD       Initial Encounter Date:  Flowsheet Row CARDIAC REHAB PHASE II ORIENTATION from 04/25/2023 in [place] CARDIAC REHABILITATION  Date 04/25/23       Visit Diagnosis: ST elevation myocardial infarction involving right coronary artery [facility])  Status post coronary artery stent placement  Patient's Home Medications on Admission:  Current Outpatient Medications:    aspirin 81 MG chewable tablet, Chew 1 tablet (81 mg total) by mouth daily., Disp: 90 tablet, Rfl: 2   atorvastatin (LIPITOR) 40 MG tablet, Take 1 tablet (40 mg total) by mouth daily., Disp: 90 tablet, Rfl: 3   diazepam (VALIUM) 2 MG tablet, Take 2 mg by mouth daily as needed for anxiety., Disp: , Rfl:    Evolocumab (REPATHA SURECLICK) 140 MG/ML SOAJ, Inject 140 mg into the skin every 14 (fourteen) days., Disp: 6 mL, Rfl: 3   metoprolol tartrate (LOPRESSOR) 25 MG tablet, Take 1 tablet (25 mg total) by mouth 2 (two) times daily., Disp: 180 tablet, Rfl: 1   nicotine (NICODERM CQ - DOSED IN MG/24 HOURS) 14 mg/24hr patch, Place 1 patch (14 mg total) onto the skin daily at 6 (six) AM., Disp: 28 patch, Rfl: 0   nitroGLYCERIN (NITROSTAT) 0.4 MG SL tablet, Place 1 tablet (0.4 mg total) under the tongue every 5 (five) minutes as needed., Disp: 25 tablet, Rfl: 2   tadalafil (CIALIS) 5 MG tablet, Take 1 tablet (5 mg total) by mouth daily as needed for erectile dysfunction., Disp: 30 tablet, Rfl: 11   ticagrelor (BRILINTA) 90 MG TABS tablet, Take 1 tablet (90 mg total) by mouth 2 (two) times daily., Disp: 180 tablet, Rfl: 3  Past Medical History: Past Medical History:  Diagnosis Date   GERD (gastroesophageal reflux disease)    Hyperlipidemia    Hypertension     does not see a cardiologist   TIA (transient ischemic attack)    X2    Tobacco Use: Social History   Tobacco Use  Smoking Status Former   Current packs/day: 0.00   Average packs/day: 0.5 packs/day for 18.0 years (9.0 ttl pk-yrs)   Types: Cigarettes   Quit date: 03/2023   Years since quitting: 0.4  Smokeless Tobacco Not on file    Labs: Review Flowsheet       Latest Ref Rng & Units 04/04/2023 07/13/2023  Labs for ITP Cardiac and Pulmonary Rehab  Cholestrol 100 - 199 mg/dL 409  65   LDL (calc) 0 - 99 mg/dL 811  12   HDL-C >91 mg/dL 38  40   Trlycerides 0 - 149 mg/dL 65  49   Hemoglobin Y7W 4.8 - 5.6 % 5.7  -  TCO2 22 - 32 mmol/L 25  -    Details            Capillary Blood Glucose: No results found for: "GLUCAP"   Exercise Target Goals: Exercise Program Goal: Individual exercise prescription set using results from initial 6 min walk test and THRR while considering  patient's activity barriers and safety.   Exercise Prescription Goal: Starting with aerobic activity 30 plus minutes a day, 3 days per week for initial exercise prescription. Provide home exercise prescription and guidelines that participant acknowledges understanding prior to discharge.  Activity Barriers & Risk Stratification:  Activity Barriers & Cardiac Risk Stratification - 04/24/23 1520       Activity Barriers & Cardiac Risk Stratification   Activity Barriers Deconditioning;Muscular Weakness;Other (comment);Back Problems;Joint Problems;Shortness of Breath    Comments Left leg claudication, back pain from previous surgeries, TIA x2 residuals on right side (droop/speech)    Cardiac Risk Stratification High             6 Minute Walk:  6 Minute Walk     Row Name 04/25/23 1410         6 Minute Walk   Phase Initial     Distance 1300 feet     Walk Time 6 minutes     # of Rest Breaks 0     MPH 2.46     METS 3.76     RPE 15     VO2 Peak 13.16     Symptoms Yes (comment)      Comments claudication pain 4/5     Resting HR 71 bpm     Resting BP 126/72     Resting Oxygen Saturation  96 %     Exercise Oxygen Saturation  during 6 min walk 98 %     Max Ex. HR 107 bpm     Max Ex. BP 134/78     2 Minute Post BP 124/60              Oxygen Initial Assessment:   Oxygen Re-Evaluation:   Oxygen Discharge (Final Oxygen Re-Evaluation):   Initial Exercise Prescription:  Initial Exercise Prescription - 04/25/23 1400       Date of Initial Exercise RX and Referring Provider   Date 04/25/23    Referring Provider Aleen Campi MD      Oxygen   Maintain Oxygen Saturation 88% or higher      Treadmill   MPH 2.4    Grade 0    Minutes 15    METs 2.84      REL-XR   Level 3    Speed 50    Minutes 15    METs 2.5      Prescription Details   Frequency (times per week) 2    Duration Progress to 30 minutes of continuous aerobic without signs/symptoms of physical distress      Intensity   THRR 40-80% of Max Heartrate 108-145    Ratings of Perceived Exertion 11-13    Perceived Dyspnea 0-4      Progression   Progression Continue to progress workloads to maintain intensity without signs/symptoms of physical distress.      Resistance Training   Training Prescription Yes    Weight 4 lb    Reps 10-15             Perform Capillary Blood Glucose checks as needed.  Exercise Prescription Changes:   Exercise Prescription Changes     Row Name 04/25/23 1400 05/10/23 1400 05/31/23 1300 06/12/23 1300 06/28/23 1500     Response to Exercise   Blood Pressure (Admit) 126/72 144/84 -- 150/82 128/84   Blood Pressure (Exercise) 134/78 132/76 -- -- --   Blood Pressure (Exit) 124/60 132/74 -- 102/62 120/70   Heart Rate (Admit) 71 bpm 81 bpm -- 74 bpm 66 bpm   Heart Rate (Exercise) 107 bpm 105 bpm -- 99 bpm 94 bpm   Heart Rate (Exit) 63 bpm 84 bpm -- 70 bpm 68 bpm   Oxygen Saturation (Admit) 96 % -- -- -- --   Oxygen Saturation (Exercise)  98 % -- -- -- --    Rating of Perceived Exertion (Exercise) 15 13 -- 15 12   Symptoms claudication pain 4/5 -- -- -- --   Comments walk test results -- -- -- --   Duration -- Continue with 30 min of aerobic exercise without signs/symptoms of physical distress. -- Continue with 30 min of aerobic exercise without signs/symptoms of physical distress. Continue with 30 min of aerobic exercise without signs/symptoms of physical distress.   Intensity -- THRR unchanged -- THRR unchanged THRR unchanged     Progression   Progression -- Continue to progress workloads to maintain intensity without signs/symptoms of physical distress. -- Continue to progress workloads to maintain intensity without signs/symptoms of physical distress. Continue to progress workloads to maintain intensity without signs/symptoms of physical distress.     Resistance Training   Training Prescription -- Yes -- Yes Yes   Weight -- 4 / blue band -- 4 lbs / blue band 4 lbs / blue band   Reps -- 10-15 -- 10-15 10-15     Treadmill   MPH -- 2 -- 2 2   Grade -- 0.5 -- 0.5 0   Minutes -- 15 -- 15 15   METs -- 2.67 -- 2.67 2.53     REL-XR   Level -- 4 -- 4 4   Speed -- 51 -- 44 49   Minutes -- 15 -- 15 15   METs -- 3.7 -- 2.7 3.5     Home Exercise Plan   Plans to continue exercise at -- -- Home (comment)  walking Home (comment) Home (comment)   Frequency -- -- Add 3 additional days to program exercise sessions. Add 3 additional days to program exercise sessions. Add 3 additional days to program exercise sessions.   Initial Home Exercises Provided -- -- 05/31/23 -- --     Oxygen   Maintain Oxygen Saturation -- 88% or higher -- 88% or higher 88% or higher            Exercise Comments:   Exercise Goals and Review:   Exercise Goals     Row Name 04/25/23 1412             Exercise Goals   Increase Physical Activity Yes       Intervention Provide advice, education, support and counseling about physical activity/exercise  needs.;Develop an individualized exercise prescription for aerobic and resistive training based on initial evaluation findings, risk stratification, comorbidities and participant's personal goals.       Expected Outcomes Short Term: Attend rehab on a regular basis to increase amount of physical activity.;Long Term: Add in home exercise to make exercise part of routine and to increase amount of physical activity.;Long Term: Exercising regularly at least 3-5 days a week.       Increase Strength and Stamina Yes       Intervention Provide advice, education, support and counseling about physical activity/exercise needs.;Develop an individualized exercise prescription for aerobic and resistive training based on initial evaluation findings, risk stratification, comorbidities and participant's personal goals.       Expected Outcomes Short Term: Increase workloads from initial exercise prescription for resistance, speed, and METs.;Short Term: Perform resistance training exercises routinely during rehab and add in resistance training at home;Long Term: Improve cardiorespiratory fitness, muscular endurance and strength as measured by increased METs and functional capacity ( )       Able to understand and use rate of perceived exertion (RPE) scale Yes  Intervention Provide education and explanation on how to use RPE scale       Expected Outcomes Short Term: Able to use RPE daily in rehab to express subjective intensity level;Long Term:  Able to use RPE to guide intensity level when exercising independently       Able to understand and use Dyspnea scale Yes       Intervention Provide education and explanation on how to use Dyspnea scale       Expected Outcomes Short Term: Able to use Dyspnea scale daily in rehab to express subjective sense of shortness of breath during exertion;Long Term: Able to use Dyspnea scale to guide intensity level when exercising independently       Knowledge and understanding of  Target Heart Rate Range (THRR) Yes       Intervention Provide education and explanation of THRR including how the numbers were predicted and where they are located for reference       Expected Outcomes Short Term: Able to state/look up THRR;Long Term: Able to use THRR to govern intensity when exercising independently;Short Term: Able to use daily as guideline for intensity in rehab       Able to check pulse independently Yes       Intervention Provide education and demonstration on how to check pulse in carotid and radial arteries.;Review the importance of being able to check your own pulse for safety during independent exercise       Expected Outcomes Short Term: Able to explain why pulse checking is important during independent exercise;Long Term: Able to check pulse independently and accurately       Understanding of Exercise Prescription Yes       Intervention Provide education, explanation, and written materials on patient's individual exercise prescription       Expected Outcomes Short Term: Able to explain program exercise prescription;Long Term: Able to explain home exercise prescription to exercise independently       Improve claudication pain toleration; Improve walking ability Yes       Intervention Attend education sessions to aid in risk factor modification and understanding of disease process       Expected Outcomes Short Term: Improve walking distance/time to onset of claudication pain;Long Term: Improve walking ability and toleration to claudication                Exercise Goals Re-Evaluation :  Exercise Goals Re-Evaluation     Row Name 04/25/23 1413 05/10/23 1332 05/15/23 1425 05/31/23 1344 06/13/23 1319     Exercise Goal Re-Evaluation   Exercise Goals Review Able to understand and use rate of perceived exertion (RPE) scale;Able to understand and use Dyspnea scale Increase Physical Activity;Increase Strength and Stamina;Understanding of Exercise Prescription Increase  Physical Activity;Able to understand and use rate of perceived exertion (RPE) scale;Understanding of Exercise Prescription;Knowledge and understanding of Target Heart Rate Range (THRR);Able to check pulse independently;Increase Strength and Stamina Increase Physical Activity;Increase Strength and Stamina;Able to understand and use rate of perceived exertion (RPE) scale;Able to understand and use Dyspnea scale;Knowledge and understanding of Target Heart Rate Range (THRR);Able to check pulse independently;Understanding of Exercise Prescription Increase Physical Activity;Increase Strength and Stamina;Understanding of Exercise Prescription   Comments Reviewed RPE  and dyspnea scale, and program prescription with pt today.  Pt voiced understanding and was given a copy of goals to take home. Pt is on his third exercise session in rehab.  He states that he does feel like he has more endurance since starting the program.  Pt increased to level 4 on the elliptical today, and he thinks he has also increased his speed on the treadmill. Pt is doing well in rehab so far. He is currently exercising at level 4 on the XR and walking a speed of 2 on the treadmill. Will monitor and progress as able. Dale Fowler is doing well in rehab.  He is walking some at home.  Reviewed home exercise with pt today.  Pt plans to walk at park and use weights at home for exercise.  Reviewed THR, pulse, RPE, sign and symptoms, pulse oximetery and when to call 911 or MD.  Also discussed weather considerations and indoor options.  Pt voiced understanding.  He feels that his stamina is getting better. Dale Fowler is tolerating exericse well. He has been showing up late to class due to work sch. He has not increased his level on the XR in the last two week. He is exercising at an RPE of 15. Will continue to monitor and progress as able.   Expected Outcomes Short: Use RPE daily to regulate intensity.  Long: Follow program prescription Short term:  Go over home exercise  routine  Long term:  Continue to come to rehab 2 days a week to improve strength/endurance Short term: continue to exericse at worklaods until stamina has increaseed   long term: continue to attend cardiac rehab Short: Start to add in more exercise at home Long; Continue to improve stamina Short : continue to exercise at an RPE of 11-13 then increase level when able    long term: continue to attend rehab             Discharge Exercise Prescription (Final Exercise Prescription Changes):  Exercise Prescription Changes - 06/28/23 1500       Response to Exercise   Blood Pressure (Admit) 128/84    Blood Pressure (Exit) 120/70    Heart Rate (Admit) 66 bpm    Heart Rate (Exercise) 94 bpm    Heart Rate (Exit) 68 bpm    Rating of Perceived Exertion (Exercise) 12    Duration Continue with 30 min of aerobic exercise without signs/symptoms of physical distress.    Intensity THRR unchanged      Progression   Progression Continue to progress workloads to maintain intensity without signs/symptoms of physical distress.      Resistance Training   Training Prescription Yes    Weight 4 lbs / blue band    Reps 10-15      Treadmill   MPH 2    Grade 0    Minutes 15    METs 2.53      REL-XR   Level 4    Speed 49    Minutes 15    METs 3.5      Home Exercise Plan   Plans to continue exercise at Home (comment)    Frequency Add 3 additional days to program exercise sessions.      Oxygen   Maintain Oxygen Saturation 88% or higher             Nutrition:  Target Goals: Understanding of nutrition guidelines, daily intake of sodium 1500mg , cholesterol 200mg , calories 30% from fat and 7% or less from saturated fats, daily to have 5 or more servings of fruits and vegetables.  Biometrics:  Pre Biometrics - 04/25/23 1414       Pre Biometrics   Height 5\' 7"  (1.702 m)    Weight 175 lb 1.6 oz (79.4 kg)  Waist Circumference 34 inches    Hip Circumference 36 inches    Waist to Hip  Ratio 0.94 %    BMI (Calculated) 27.42    Grip Strength 35.8 kg    Single Leg Stand 27.6 seconds              Nutrition Therapy Plan and Nutrition Goals:  Nutrition Therapy & Goals - 04/24/23 1547       Intervention Plan   Intervention Prescribe, educate and counsel regarding individualized specific dietary modifications aiming towards targeted core components such as weight, hypertension, lipid management, diabetes, heart failure and other comorbidities.;Nutrition handout(s) given to patient.    Expected Outcomes Short Term Goal: Understand basic principles of dietary content, such as calories, fat, sodium, cholesterol and nutrients.;Short Term Goal: A plan has been developed with personal nutrition goals set during dietitian appointment.;Long Term Goal: Adherence to prescribed nutrition plan.             Nutrition Assessments:  MEDIFICTS Score Key: >=70 Need to make dietary changes  40-70 Heart Healthy Diet <= 40 Therapeutic Level Cholesterol Diet  Flowsheet Row CARDIAC REHAB PHASE II ORIENTATION from 04/25/2023 in [facility] CARDIAC REHABILITATION  Picture Your Plate Total Score on Admission 58 (P)       Picture Your Plate Scores: <40 Unhealthy dietary pattern with much room for improvement. 41-50 Dietary pattern unlikely to meet recommendations for good health and room for improvement. 51-60 More healthful dietary pattern, with some room for improvement.  >60 Healthy dietary pattern, although there may be some specific behaviors that could be improved.    Nutrition Goals Re-Evaluation:  Nutrition Goals Re-Evaluation     Row Name 05/10/23 1345 05/31/23 1353           Goals   Nutrition Goal Heart healthy diet Short term: Pt will try to limit his sweets to 3-4 days a week Long term: Pt will try to limit his snacking at night      Comment Pt eats2 meals a day and snacks at night. Pt does not eat a lot of red meat, he prefers seafood which is usually baked.  Pt's  girlfriend is helping with his healthy nutrition by buying fresh fruit and vegetables.  Pt stated that he does eat sweets every day, and he especially likes cakes.  We discussed trying to limit his sweets to 3-4 days a week and also not snacking as much at night after 7 pm. Dale Fowler is doing well in rehab.  He continues to work on his diet.  He still does not have the best appetite.  He says he will find that there are days he just does not feel like eating. We talked about following mechainical eating or drinking meal replacement on those days to avoid starvation mode.  He is still eating at night and still eating sweets but he is working on it some.      Expected Outcome Short term:  Pt will try to limit his sweets to 3-4 days a week  Long term:  Pt will try to limit his snacking at night Short: on poor appetite days try meal replacement Long: Continue to back off late night snacking               Nutrition Goals Discharge (Final Nutrition Goals Re-Evaluation):  Nutrition Goals Re-Evaluation - 05/31/23 1353       Goals   Nutrition Goal Short term: Pt will try to limit his sweets to 3-4 days a  week Long term: Pt will try to limit his snacking at night    Comment Dale Fowler is doing well in rehab.  He continues to work on his diet.  He still does not have the best appetite.  He says he will find that there are days he just does not feel like eating. We talked about following mechainical eating or drinking meal replacement on those days to avoid starvation mode.  He is still eating at night and still eating sweets but he is working on it some.    Expected Outcome Short: on poor appetite days try meal replacement Long: Continue to back off late night snacking             Psychosocial: Target Goals: Acknowledge presence or absence of significant depression and/or stress, maximize coping skills, provide positive support system. Participant is able to verbalize types and ability to use techniques and skills  needed for reducing stress and depression.  Initial Review & Psychosocial Screening:  Initial Psych Review & Screening - 04/24/23 1523       Initial Review   Current issues with History of Depression;Current Anxiety/Panic;Current Depression;Current Stress Concerns;Current Sleep Concerns    Source of Stress Concerns Chronic Illness;Family    Comments not a good sleeping recently has been more restless at night, depression worse since heart event, several deaths in the family      Family Dynamics   Good Support System? Yes   girlfriend and kids live with them 14, 20, 25     Barriers   Psychosocial barriers to participate in program The patient should benefit from training in stress management and relaxation.;Psychosocial barriers identified (see note)      Screening Interventions   Interventions Encouraged to exercise;Provide feedback about the scores to participant;To provide support and resources with identified psychosocial needs    Expected Outcomes Short Term goal: Utilizing psychosocial counselor, staff and physician to assist with identification of specific Stressors or current issues interfering with healing process. Setting desired goal for each stressor or current issue identified.;Long Term Goal: Stressors or current issues are controlled or eliminated.;Short Term goal: Identification and review with participant of any Quality of Life or Depression concerns found by scoring the questionnaire.;Long Term goal: The participant improves quality of Life and PHQ9 Scores as seen by post scores and/or verbalization of changes             Quality of Life Scores:  Quality of Life - 04/25/23 1414       Quality of Life   Select Quality of Life      Quality of Life Scores   Health/Function Pre 22.39 %    Socioeconomic Pre 25 %    Psych/Spiritual Pre 30 %    Family Pre 30 %    GLOBAL Pre 25.71 %            Scores of 19 and below usually indicate a poorer quality of life in  these areas.  A difference of  2-3 points is a clinically meaningful difference.  A difference of 2-3 points in the total score of the Quality of Life Index has been associated with significant improvement in overall quality of life, self-image, physical symptoms, and general health in studies assessing change in quality of life.  PHQ-9: Review Flowsheet       05/22/2023 04/25/2023  Depression screen PHQ 2/9  Decreased Interest 2 3  Down, Depressed, Hopeless 1 1  PHQ - 2 Score 3 4  Altered sleeping  3 1  Tired, decreased energy 2 3  Change in appetite 3 3  Feeling bad or failure about yourself  2 2  Trouble concentrating 3 3  Moving slowly or fidgety/restless 1 1  Suicidal thoughts 0 0  PHQ-9 Score 17 17  Difficult doing work/chores Somewhat difficult Somewhat difficult    Details           Interpretation of Total Score  Total Score Depression Severity:  1-4 = Minimal depression, 5-9 = Mild depression, 10-14 = Moderate depression, 15-19 = Moderately severe depression, 20-27 = Severe depression   Psychosocial Evaluation and Intervention:  Psychosocial Evaluation - 04/24/23 1541       Psychosocial Evaluation & Interventions   Comments Jaaden is coming into rehab after STEMI and stent.  He is looking forward to rehab to build back his stamina and getting back to his normal.  He and his girlfriend want to get his confidence back and get him back to himself again.  He does have a history of TIA x2 which has left him with some residual weakness on right side and slurred speech with some facial droop, but he does not let this slow him down.  He also has a history of depression, but it seems to have worsened since his heart even along with more anxiety.  They also mentioned that there have been several deaths in the family recently, which has him grieving more.  He lives with his girlfriend and their 3 kids who are all very supportive.  They noted that he has been a lot more restless since  his heart event and not sleeping as well.  We talked about how exercise could help and if it doesn't then talking to the doctor about it as they have not brought it up yet.  They were both open to this.  We encouraged his girlfriend to come tomorrow for orientation and to attend education classes as well if she wanted.  His barriers to rehab are his chronic back pain and claudication pain which both limit his activity.  We talked through how we would work around these during rehab.  He quit smoking after his heart attack and was commended on his success.  So far he is doing well with it, but this could be adding to some of his anxiety too.    Expected Outcomes Short: Attend rehab to build stamina and confidence Long: Continue to work through grieving process    Continue Psychosocial Services  Follow up required by staff             Psychosocial Re-Evaluation:  Psychosocial Re-Evaluation     Row Name 05/10/23 1337 05/31/23 1345           Psychosocial Re-Evaluation   Current issues with Current Sleep Concerns;Current Stress Concerns;History of Depression Current Sleep Concerns;Current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

## 2023-08-16 ENCOUNTER — Encounter (HOSPITAL_COMMUNITY): Payer: Medicare Other

## 2023-08-21 ENCOUNTER — Encounter (HOSPITAL_COMMUNITY): Payer: Medicare Other

## 2023-08-23 ENCOUNTER — Encounter (HOSPITAL_COMMUNITY): Payer: Medicare Other

## 2023-08-28 ENCOUNTER — Telehealth (HOSPITAL_COMMUNITY): Payer: Self-pay | Admitting: *Deleted

## 2023-08-28 NOTE — Telephone Encounter (Signed)
Called patient, unable to attend cardiac rehab due to arthritis discomfort in legs. If patient is better and able to return, instructed him to let his cardiologist send [place] another referral and we can start him again.

## 2023-08-30 ENCOUNTER — Encounter (HOSPITAL_COMMUNITY): Payer: Self-pay | Admitting: *Deleted

## 2023-08-30 DIAGNOSIS — Z955 Presence of coronary angioplasty implant and graft: Secondary | ICD-10-CM

## 2023-08-30 DIAGNOSIS — I2111 ST elevation (STEMI) myocardial infarction involving right coronary artery: Secondary | ICD-10-CM

## 2023-08-30 NOTE — Progress Notes (Signed)
Cardiac Individual Treatment Plan  Patient Details  Name: Dale Fowler MRN: [id number] Date of Birth: July 22, 1966 Referring Provider:   Flowsheet Row CARDIAC REHAB PHASE II ORIENTATION from 04/25/2023 in [facility] CARDIAC REHABILITATION  Referring Provider Aleen Campi MD       Initial Encounter Date:  Flowsheet Row CARDIAC REHAB PHASE II ORIENTATION from 04/25/2023 in [facility] CARDIAC REHABILITATION  Date 04/25/23       Visit Diagnosis: ST elevation myocardial infarction involving right coronary artery [facility])  Status post coronary artery stent placement  Patient's Home Medications on Admission:  Current Outpatient Medications:    aspirin 81 MG chewable tablet, Chew 1 tablet (81 mg total) by mouth daily., Disp: 90 tablet, Rfl: 2   atorvastatin (LIPITOR) 40 MG tablet, Take 1 tablet (40 mg total) by mouth daily., Disp: 90 tablet, Rfl: 3   diazepam (VALIUM) 2 MG tablet, Take 2 mg by mouth daily as needed for anxiety., Disp: , Rfl:    Evolocumab (REPATHA SURECLICK) 140 MG/ML SOAJ, Inject 140 mg into the skin every 14 (fourteen) days., Disp: 6 mL, Rfl: 3   metoprolol tartrate (LOPRESSOR) 25 MG tablet, Take 1 tablet (25 mg total) by mouth 2 (two) times daily., Disp: 180 tablet, Rfl: 1   nicotine (NICODERM CQ - DOSED IN MG/24 HOURS) 14 mg/24hr patch, Place 1 patch (14 mg total) onto the skin daily at 6 (six) AM., Disp: 28 patch, Rfl: 0   nitroGLYCERIN (NITROSTAT) 0.4 MG SL tablet, Place 1 tablet (0.4 mg total) under the tongue every 5 (five) minutes as needed., Disp: 25 tablet, Rfl: 2   tadalafil (CIALIS) 5 MG tablet, Take 1 tablet (5 mg total) by mouth daily as needed for erectile dysfunction., Disp: 30 tablet, Rfl: 11   ticagrelor (BRILINTA) 90 MG TABS tablet, Take 1 tablet (90 mg total) by mouth 2 (two) times daily., Disp: 180 tablet, Rfl: 3  Past Medical History: Past Medical History:  Diagnosis Date   GERD (gastroesophageal reflux disease)    Hyperlipidemia    Hypertension     does not see a cardiologist   TIA (transient ischemic attack)    X2    Tobacco Use: Social History   Tobacco Use  Smoking Status Former   Current packs/day: 0.00   Average packs/day: 0.5 packs/day for 18.0 years (9.0 ttl pk-yrs)   Types: Cigarettes   Quit date: 03/2023   Years since quitting: 0.4  Smokeless Tobacco Not on file    Labs: Review Flowsheet       Latest Ref Rng & Units 04/04/2023 07/13/2023  Labs for ITP Cardiac and Pulmonary Rehab  Cholestrol 100 - 199 mg/dL 409  65   LDL (calc) 0 - 99 mg/dL 811  12   HDL-C >91 mg/dL 38  40   Trlycerides 0 - 149 mg/dL 65  49   Hemoglobin Y7W 4.8 - 5.6 % 5.7  -  TCO2 22 - 32 mmol/L 25  -    Capillary Blood Glucose: No results found for: "GLUCAP"   Exercise Target Goals: Exercise Program Goal: Individual exercise prescription set using results from initial 6 min walk test and THRR while considering  patient's activity barriers and safety.   Exercise Prescription Goal: Starting with aerobic activity 30 plus minutes a day, 3 days per week for initial exercise prescription. Provide home exercise prescription and guidelines that participant acknowledges understanding prior to discharge.  Activity Barriers & Risk Stratification:  Activity Barriers & Cardiac Risk Stratification - 04/24/23 1520  Activity Barriers & Cardiac Risk Stratification   Activity Barriers Deconditioning;Muscular Weakness;Other (comment);Back Problems;Joint Problems;Shortness of Breath    Comments Left leg claudication, back pain from previous surgeries, TIA x2 residuals on right side (droop/speech)    Cardiac Risk Stratification High             6 Minute Walk:  6 Minute Walk     Row Name 04/25/23 1410         6 Minute Walk   Phase Initial     Distance 1300 feet     Walk Time 6 minutes     # of Rest Breaks 0     MPH 2.46     METS 3.76     RPE 15     VO2 Peak 13.16     Symptoms Yes (comment)     Comments claudication pain 4/5      Resting HR 71 bpm     Resting BP 126/72     Resting Oxygen Saturation  96 %     Exercise Oxygen Saturation  during 6 min walk 98 %     Max Ex. HR 107 bpm     Max Ex. BP 134/78     2 Minute Post BP 124/60              Oxygen Initial Assessment:   Oxygen Re-Evaluation:   Oxygen Discharge (Final Oxygen Re-Evaluation):   Initial Exercise Prescription:  Initial Exercise Prescription - 04/25/23 1400       Date of Initial Exercise RX and Referring Provider   Date 04/25/23    Referring Provider Aleen Campi MD      Oxygen   Maintain Oxygen Saturation 88% or higher      Treadmill   MPH 2.4    Grade 0    Minutes 15    METs 2.84      REL-XR   Level 3    Speed 50    Minutes 15    METs 2.5      Prescription Details   Frequency (times per week) 2    Duration Progress to 30 minutes of continuous aerobic without signs/symptoms of physical distress      Intensity   THRR 40-80% of Max Heartrate 108-145    Ratings of Perceived Exertion 11-13    Perceived Dyspnea 0-4      Progression   Progression Continue to progress workloads to maintain intensity without signs/symptoms of physical distress.      Resistance Training   Training Prescription Yes    Weight 4 lb    Reps 10-15             Perform Capillary Blood Glucose checks as needed.  Exercise Prescription Changes:   Exercise Prescription Changes     Row Name 04/25/23 1400 05/10/23 1400 05/31/23 1300 06/12/23 1300 06/28/23 1500     Response to Exercise   Blood Pressure (Admit) 126/72 144/84 -- 150/82 128/84   Blood Pressure (Exercise) 134/78 132/76 -- -- --   Blood Pressure (Exit) 124/60 132/74 -- 102/62 120/70   Heart Rate (Admit) 71 bpm 81 bpm -- 74 bpm 66 bpm   Heart Rate (Exercise) 107 bpm 105 bpm -- 99 bpm 94 bpm   Heart Rate (Exit) 63 bpm 84 bpm -- 70 bpm 68 bpm   Oxygen Saturation (Admit) 96 % -- -- -- --   Oxygen Saturation (Exercise) 98 % -- -- -- --   Rating of Perceived Exertion  (Exercise) 15  13 -- 15 12   Symptoms claudication pain 4/5 -- -- -- --   Comments walk test results -- -- -- --   Duration -- Continue with 30 min of aerobic exercise without signs/symptoms of physical distress. -- Continue with 30 min of aerobic exercise without signs/symptoms of physical distress. Continue with 30 min of aerobic exercise without signs/symptoms of physical distress.   Intensity -- THRR unchanged -- THRR unchanged THRR unchanged     Progression   Progression -- Continue to progress workloads to maintain intensity without signs/symptoms of physical distress. -- Continue to progress workloads to maintain intensity without signs/symptoms of physical distress. Continue to progress workloads to maintain intensity without signs/symptoms of physical distress.     Resistance Training   Training Prescription -- Yes -- Yes Yes   Weight -- 4 / blue band -- 4 lbs / blue band 4 lbs / blue band   Reps -- 10-15 -- 10-15 10-15     Treadmill   MPH -- 2 -- 2 2   Grade -- 0.5 -- 0.5 0   Minutes -- 15 -- 15 15   METs -- 2.67 -- 2.67 2.53     REL-XR   Level -- 4 -- 4 4   Speed -- 51 -- 44 49   Minutes -- 15 -- 15 15   METs -- 3.7 -- 2.7 3.5     Home Exercise Plan   Plans to continue exercise at -- -- Home (comment)  walking Home (comment) Home (comment)   Frequency -- -- Add 3 additional days to program exercise sessions. Add 3 additional days to program exercise sessions. Add 3 additional days to program exercise sessions.   Initial Home Exercises Provided -- -- 05/31/23 -- --     Oxygen   Maintain Oxygen Saturation -- 88% or higher -- 88% or higher 88% or higher            Exercise Comments:   Exercise Goals and Review:   Exercise Goals     Row Name 04/25/23 1412             Exercise Goals   Increase Physical Activity Yes       Intervention Provide advice, education, support and counseling about physical activity/exercise needs.;Develop an individualized exercise  prescription for aerobic and resistive training based on initial evaluation findings, risk stratification, comorbidities and participant's personal goals.       Expected Outcomes Short Term: Attend rehab on a regular basis to increase amount of physical activity.;Long Term: Add in home exercise to make exercise part of routine and to increase amount of physical activity.;Long Term: Exercising regularly at least 3-5 days a week.       Increase Strength and Stamina Yes       Intervention Provide advice, education, support and counseling about physical activity/exercise needs.;Develop an individualized exercise prescription for aerobic and resistive training based on initial evaluation findings, risk stratification, comorbidities and participant's personal goals.       Expected Outcomes Short Term: Increase workloads from initial exercise prescription for resistance, speed, and METs.;Short Term: Perform resistance training exercises routinely during rehab and add in resistance training at home;Long Term: Improve cardiorespiratory fitness, muscular endurance and strength as measured by increased METs and functional capacity ( )       Able to understand and use rate of perceived exertion (RPE) scale Yes       Intervention Provide education and explanation on how to use RPE scale  Expected Outcomes Short Term: Able to use RPE daily in rehab to express subjective intensity level;Long Term:  Able to use RPE to guide intensity level when exercising independently       Able to understand and use Dyspnea scale Yes       Intervention Provide education and explanation on how to use Dyspnea scale       Expected Outcomes Short Term: Able to use Dyspnea scale daily in rehab to express subjective sense of shortness of breath during exertion;Long Term: Able to use Dyspnea scale to guide intensity level when exercising independently       Knowledge and understanding of Target Heart Rate Range (THRR) Yes        Intervention Provide education and explanation of THRR including how the numbers were predicted and where they are located for reference       Expected Outcomes Short Term: Able to state/look up THRR;Long Term: Able to use THRR to govern intensity when exercising independently;Short Term: Able to use daily as guideline for intensity in rehab       Able to check pulse independently Yes       Intervention Provide education and demonstration on how to check pulse in carotid and radial arteries.;Review the importance of being able to check your own pulse for safety during independent exercise       Expected Outcomes Short Term: Able to explain why pulse checking is important during independent exercise;Long Term: Able to check pulse independently and accurately       Understanding of Exercise Prescription Yes       Intervention Provide education, explanation, and written materials on patient's individual exercise prescription       Expected Outcomes Short Term: Able to explain program exercise prescription;Long Term: Able to explain home exercise prescription to exercise independently       Improve claudication pain toleration; Improve walking ability Yes       Intervention Attend education sessions to aid in risk factor modification and understanding of disease process       Expected Outcomes Short Term: Improve walking distance/time to onset of claudication pain;Long Term: Improve walking ability and toleration to claudication                Exercise Goals Re-Evaluation :  Exercise Goals Re-Evaluation     Row Name 04/25/23 1413 05/10/23 1332 05/15/23 1425 05/31/23 1344 06/13/23 1319     Exercise Goal Re-Evaluation   Exercise Goals Review Able to understand and use rate of perceived exertion (RPE) scale;Able to understand and use Dyspnea scale Increase Physical Activity;Increase Strength and Stamina;Understanding of Exercise Prescription Increase Physical Activity;Able to understand and use rate  of perceived exertion (RPE) scale;Understanding of Exercise Prescription;Knowledge and understanding of Target Heart Rate Range (THRR);Able to check pulse independently;Increase Strength and Stamina Increase Physical Activity;Increase Strength and Stamina;Able to understand and use rate of perceived exertion (RPE) scale;Able to understand and use Dyspnea scale;Knowledge and understanding of Target Heart Rate Range (THRR);Able to check pulse independently;Understanding of Exercise Prescription Increase Physical Activity;Increase Strength and Stamina;Understanding of Exercise Prescription   Comments Reviewed RPE  and dyspnea scale, and program prescription with pt today.  Pt voiced understanding and was given a copy of goals to take home. Pt is on his third exercise session in rehab.  He states that he does feel like he has more endurance since starting the program.  Pt increased to level 4 on the elliptical today, and he thinks he has also increased  his speed on the treadmill. Pt is doing well in rehab so far. He is currently exercising at level 4 on the XR and walking a speed of 2 on the treadmill. Will monitor and progress as able. Dale Fowler is doing well in rehab.  He is walking some at home.  Reviewed home exercise with pt today.  Pt plans to walk at park and use weights at home for exercise.  Reviewed THR, pulse, RPE, sign and symptoms, pulse oximetery and when to call 911 or MD.  Also discussed weather considerations and indoor options.  Pt voiced understanding.  He feels that his stamina is getting better. Dale Fowler is tolerating exericse well. He has been showing up late to class due to work sch. He has not increased his level on the XR in the last two week. He is exercising at an RPE of 15. Will continue to monitor and progress as able.   Expected Outcomes Short: Use RPE daily to regulate intensity.  Long: Follow program prescription Short term:  Go over home exercise routine  Long term:  Continue to come to rehab 2  days a week to improve strength/endurance Short term: continue to exericse at worklaods until stamina has increaseed   long term: continue to attend cardiac rehab Short: Start to add in more exercise at home Long; Continue to improve stamina Short : continue to exercise at an RPE of 11-13 then increase level when able    long term: continue to attend rehab             Discharge Exercise Prescription (Final Exercise Prescription Changes):  Exercise Prescription Changes - 06/28/23 1500       Response to Exercise   Blood Pressure (Admit) 128/84    Blood Pressure (Exit) 120/70    Heart Rate (Admit) 66 bpm    Heart Rate (Exercise) 94 bpm    Heart Rate (Exit) 68 bpm    Rating of Perceived Exertion (Exercise) 12    Duration Continue with 30 min of aerobic exercise without signs/symptoms of physical distress.    Intensity THRR unchanged      Progression   Progression Continue to progress workloads to maintain intensity without signs/symptoms of physical distress.      Resistance Training   Training Prescription Yes    Weight 4 lbs / blue band    Reps 10-15      Treadmill   MPH 2    Grade 0    Minutes 15    METs 2.53      REL-XR   Level 4    Speed 49    Minutes 15    METs 3.5      Home Exercise Plan   Plans to continue exercise at Home (comment)    Frequency Add 3 additional days to program exercise sessions.      Oxygen   Maintain Oxygen Saturation 88% or higher             Nutrition:  Target Goals: Understanding of nutrition guidelines, daily intake of sodium 1500mg , cholesterol 200mg , calories 30% from fat and 7% or less from saturated fats, daily to have 5 or more servings of fruits and vegetables.  Biometrics:  Pre Biometrics - 04/25/23 1414       Pre Biometrics   Height 5\' 7"  (1.702 m)    Weight 175 lb 1.6 oz (79.4 kg)    Waist Circumference 34 inches    Hip Circumference 36 inches    Waist to  Hip Ratio 0.94 %    BMI (Calculated) 27.42    Grip  Strength 35.8 kg    Single Leg Stand 27.6 seconds              Nutrition Therapy Plan and Nutrition Goals:  Nutrition Therapy & Goals - 04/24/23 1547       Intervention Plan   Intervention Prescribe, educate and counsel regarding individualized specific dietary modifications aiming towards targeted core components such as weight, hypertension, lipid management, diabetes, heart failure and other comorbidities.;Nutrition handout(s) given to patient.    Expected Outcomes Short Term Goal: Understand basic principles of dietary content, such as calories, fat, sodium, cholesterol and nutrients.;Short Term Goal: A plan has been developed with personal nutrition goals set during dietitian appointment.;Long Term Goal: Adherence to prescribed nutrition plan.             Nutrition Assessments:  MEDIFICTS Score Key: >=70 Need to make dietary changes  40-70 Heart Healthy Diet <= 40 Therapeutic Level Cholesterol Diet  Flowsheet Row CARDIAC REHAB PHASE II ORIENTATION from 04/25/2023 in [facility] CARDIAC REHABILITATION  Picture Your Plate Total Score on Admission 58 (P)       Picture Your Plate Scores: <29 Unhealthy dietary pattern with much room for improvement. 41-50 Dietary pattern unlikely to meet recommendations for good health and room for improvement. 51-60 More healthful dietary pattern, with some room for improvement.  >60 Healthy dietary pattern, although there may be some specific behaviors that could be improved.    Nutrition Goals Re-Evaluation:  Nutrition Goals Re-Evaluation     Row Name 05/10/23 1345 05/31/23 1353           Goals   Nutrition Goal Heart healthy diet Short term: Pt will try to limit his sweets to 3-4 days a week Long term: Pt will try to limit his snacking at night      Comment Pt eats2 meals a day and snacks at night. Pt does not eat a lot of red meat, he prefers seafood which is usually baked.  Pt's girlfriend is helping with his healthy nutrition  by buying fresh fruit and vegetables.  Pt stated that he does eat sweets every day, and he especially likes cakes.  We discussed trying to limit his sweets to 3-4 days a week and also not snacking as much at night after 7 pm. Dale Fowler is doing well in rehab.  He continues to work on his diet.  He still does not have the best appetite.  He says he will find that there are days he just does not feel like eating. We talked about following mechainical eating or drinking meal replacement on those days to avoid starvation mode.  He is still eating at night and still eating sweets but he is working on it some.      Expected Outcome Short term:  Pt will try to limit his sweets to 3-4 days a week  Long term:  Pt will try to limit his snacking at night Short: on poor appetite days try meal replacement Long: Continue to back off late night snacking               Nutrition Goals Discharge (Final Nutrition Goals Re-Evaluation):  Nutrition Goals Re-Evaluation - 05/31/23 1353       Goals   Nutrition Goal Short term: Pt will try to limit his sweets to 3-4 days a week Long term: Pt will try to limit his snacking at night    Comment  Dale Fowler is doing well in rehab.  He continues to work on his diet.  He still does not have the best appetite.  He says he will find that there are days he just does not feel like eating. We talked about following mechainical eating or drinking meal replacement on those days to avoid starvation mode.  He is still eating at night and still eating sweets but he is working on it some.    Expected Outcome Short: on poor appetite days try meal replacement Long: Continue to back off late night snacking             Psychosocial: Target Goals: Acknowledge presence or absence of significant depression and/or stress, maximize coping skills, provide positive support system. Participant is able to verbalize types and ability to use techniques and skills needed for reducing stress and  depression.  Initial Review & Psychosocial Screening:  Initial Psych Review & Screening - 04/24/23 1523       Initial Review   Current issues with History of Depression;Current Anxiety/Panic;Current Depression;Current Stress Concerns;Current Sleep Concerns    Source of Stress Concerns Chronic Illness;Family    Comments not a good sleeping recently has been more restless at night, depression worse since heart event, several deaths in the family      Family Dynamics   Good Support System? Yes   girlfriend and kids live with them 14, 20, 25     Barriers   Psychosocial barriers to participate in program The patient should benefit from training in stress management and relaxation.;Psychosocial barriers identified (see note)      Screening Interventions   Interventions Encouraged to exercise;Provide feedback about the scores to participant;To provide support and resources with identified psychosocial needs    Expected Outcomes Short Term goal: Utilizing psychosocial counselor, staff and physician to assist with identification of specific Stressors or current issues interfering with healing process. Setting desired goal for each stressor or current issue identified.;Long Term Goal: Stressors or current issues are controlled or eliminated.;Short Term goal: Identification and review with participant of any Quality of Life or Depression concerns found by scoring the questionnaire.;Long Term goal: The participant improves quality of Life and PHQ9 Scores as seen by post scores and/or verbalization of changes             Quality of Life Scores:  Quality of Life - 04/25/23 1414       Quality of Life   Select Quality of Life      Quality of Life Scores   Health/Function Pre 22.39 %    Socioeconomic Pre 25 %    Psych/Spiritual Pre 30 %    Family Pre 30 %    GLOBAL Pre 25.71 %            Scores of 19 and below usually indicate a poorer quality of life in these areas.  A difference of   2-3 points is a clinically meaningful difference.  A difference of 2-3 points in the total score of the Quality of Life Index has been associated with significant improvement in overall quality of life, self-image, physical symptoms, and general health in studies assessing change in quality of life.  PHQ-9: Review Flowsheet       05/22/2023 04/25/2023  Depression screen PHQ 2/9  Decreased Interest 2 3  Down, Depressed, Hopeless 1 1  PHQ - 2 Score 3 4  Altered sleeping 3 1  Tired, decreased energy 2 3  Change in appetite 3 3  Feeling  bad or failure about yourself  2 2  Trouble concentrating 3 3  Moving slowly or fidgety/restless 1 1  Suicidal thoughts 0 0  PHQ-9 Score 17 17  Difficult doing work/chores Somewhat difficult Somewhat difficult   Interpretation of Total Score  Total Score Depression Severity:  1-4 = Minimal depression, 5-9 = Mild depression, 10-14 = Moderate depression, 15-19 = Moderately severe depression, 20-27 = Severe depression   Psychosocial Evaluation and Intervention:  Psychosocial Evaluation - 04/24/23 1541       Psychosocial Evaluation & Interventions   Comments Dale Fowler is coming into rehab after STEMI and stent.  He is looking forward to rehab to build back his stamina and getting back to his normal.  He and his girlfriend want to get his confidence back and get him back to himself again.  He does have a history of TIA x2 which has left him with some residual weakness on right side and slurred speech with some facial droop, but he does not let this slow him down.  He also has a history of depression, but it seems to have worsened since his heart even along with more anxiety.  They also mentioned that there have been several deaths in the family recently, which has him grieving more.  He lives with his girlfriend and their 3 kids who are all very supportive.  They noted that he has been a lot more restless since his heart event and not sleeping as well.  We talked  about how exercise could help and if it doesn't then talking to the doctor about it as they have not brought it up yet.  They were both open to this.  We encouraged his girlfriend to come tomorrow for orientation and to attend education classes as well if she wanted.  His barriers to rehab are his chronic back pain and claudication pain which both limit his activity.  We talked through how we would work around these during rehab.  He quit smoking after his heart attack and was commended on his success.  So far he is doing well with it, but this could be adding to some of his anxiety too.    Expected Outcomes Short: Attend rehab to build stamina and confidence Long: Continue to work through grieving process    Continue Psychosocial Services  Follow up required by staff             Psychosocial Re-Evaluation:  Psychosocial Re-Evaluation     Row Name 05/10/23 1337 05/31/23 1345           Psychosocial Re-Evaluation   Current issues with Current Sleep Concerns;Current Stress Concerns;History of Depression Current Sleep Concerns;Current Stress Concerns;History of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

## 2023-08-30 NOTE — Progress Notes (Signed)
Discharge Progress Report  Patient Details  Name: Dale Fowler MRN: [id number] Date of Birth: 1966-07-14 Referring Provider:   Flowsheet Row CARDIAC REHAB PHASE II ORIENTATION from 04/25/2023 in [facility] C[facility]IAC REH[facility]  Referring Provider Aleen Campi MD        Number of Visits: 17  Reason for Discharge:  Early Exit:  Personal and Lack of attendance  Smoking History:  Social History   Tobacco Use  Smoking Status Former   Current packs/day: 0.00   Average packs/day: 0.5 packs/day for 18.0 years (9.0 ttl pk-yrs)   Types: Cigarettes   Quit date: 03/2023   Years since quitting: 0.4  Smokeless Tobacco Not on file    Diagnosis:  ST elevation myocardial infarction involving right coronary artery (HCC)  Status post coronary artery stent placement  Initial Exercise Prescription:  Initial Exercise Prescription - 04/25/23 1400       Date of Initial Exercise RX and Referring Provider   Date 04/25/23    Referring Provider Aleen Campi MD      Oxygen   Maintain Oxygen Saturation 88% or higher      Treadmill   MPH 2.4    Grade 0    Minutes 15    METs 2.84      REL-XR   Level 3    Speed 50    Minutes 15    METs 2.5      Prescription Details   Frequency (times per week) 2    Duration Progress to 30 minutes of continuous aerobic without signs/symptoms of physical distress      Intensity   THRR 40-80% of Max Heartrate 108-145    Ratings of Perceived Exertion 11-13    Perceived Dyspnea 0-4      Progression   Progression Continue to progress workloads to maintain intensity without signs/symptoms of physical distress.      Resistance Training   Training Prescription Yes    Weight 4 lb    Reps 10-15             Discharge Exercise Prescription (Final Exercise Prescription Changes):  Exercise Prescription Changes - 06/28/23 1500       Response to Exercise   Blood Pressure (Admit) 128/84    Blood Pressure (Exit) 120/70    Heart Rate  (Admit) 66 bpm    Heart Rate (Exercise) 94 bpm    Heart Rate (Exit) 68 bpm    Rating of Perceived Exertion (Exercise) 12    Duration Continue with 30 min of aerobic exercise without signs/symptoms of physical distress.    Intensity THRR unchanged      Progression   Progression Continue to progress workloads to maintain intensity without signs/symptoms of physical distress.      Resistance Training   Training Prescription Yes    Weight 4 lbs / blue band    Reps 10-15      Treadmill   MPH 2    Grade 0    Minutes 15    METs 2.53      REL-XR   Level 4    Speed 49    Minutes 15    METs 3.5      Home Exercise Plan   Plans to continue exercise at Home (comment)    Frequency Add 3 additional days to program exercise sessions.      Oxygen   Maintain Oxygen Saturation 88% or higher             Functional Capacity:  6  Minute Walk     Row Name 04/25/23 1410         6 Minute Walk   Phase Initial     Distance 1300 feet     Walk Time 6 minutes     # of Rest Breaks 0     MPH 2.46     METS 3.76     RPE 15     VO2 Peak 13.16     Symptoms Yes (comment)     Comments claudication pain 4/5     Resting HR 71 bpm     Resting BP 126/72     Resting Oxygen Saturation  96 %     Exercise Oxygen Saturation  during 6 min walk 98 %     Max Ex. HR 107 bpm     Max Ex. BP 134/78     2 Minute Post BP 124/60              Psychological, QOL, Others - Outcomes: PHQ 2/9:    05/22/2023    1:57 PM 04/25/2023    2:18 PM  Depression screen PHQ 2/9  Decreased Interest 2 3  Down, Depressed, Hopeless 1 1  PHQ - 2 Score 3 4  Altered sleeping 3 1  Tired, decreased energy 2 3  Change in appetite 3 3  Feeling bad or failure about yourself  2 2  Trouble concentrating 3 3  Moving slowly or fidgety/restless 1 1  Suicidal thoughts 0 0  PHQ-9 Score 17 17  Difficult doing work/chores Somewhat difficult Somewhat difficult    Nutrition & Weight - Outcomes:  Pre Biometrics - 04/25/23  1414       Pre Biometrics   Height 5\' 7"  (1.702 m)    Weight 175 lb 1.6 oz (79.4 kg)    Waist Circumference 34 inches    Hip Circumference 36 inches    Waist to Hip Ratio 0.94 %    BMI (Calculated) 27.42    Grip Strength 35.8 kg    Single Leg Stand 27.6 seconds

## 2023-10-10 ENCOUNTER — Telehealth: Payer: Self-pay | Admitting: Nurse Practitioner

## 2023-10-10 MED ORDER — TICAGRELOR 90 MG PO TABS: 90.0000 mg | ORAL_TABLET | Freq: Two times a day (BID) | ORAL | 0 refills | Status: DC

## 2023-10-10 NOTE — Telephone Encounter (Signed)
Spoke with girlfriend Tinnelle on [place]. Advised her that we have 60 mg tablets available and he will need to take one tablet and a half twice daily. They have already filled out patient assistance forms just waiting on response. Verbalized understanding. States that she will come by [facility] office and pick them up.

## 2023-10-10 NOTE — Telephone Encounter (Signed)
Pt c/o medication issue:  1. Name of Medication:   ticagrelor (BRILINTA) 90 MG TABS tablet   2. How are you currently taking this medication (dosage and times per day)?   As prescribed  3. Are you having a reaction (difficulty breathing--STAT)?   4. What is your medication issue?   Caller (Tinnelle) stated patient has been signed up for a program to get this medication at a reduced cost but will need to get samples until prescription is available.  Caller stated patient is completely out of this medication and took his last dose last night.

## 2023-10-15 ENCOUNTER — Telehealth: Payer: Self-pay | Admitting: Nurse Practitioner

## 2023-10-15 MED ORDER — CLOPIDOGREL BISULFATE 75 MG PO TABS: ORAL_TABLET | ORAL | 2 refills | Status: DC

## 2023-10-15 NOTE — Telephone Encounter (Signed)
Patient informed and verbalized understanding of plan. Prescription sent to [facility]

## 2023-10-15 NOTE — Telephone Encounter (Signed)
Patient calling the office for samples of medication:   1.  What medication and dosage are you requesting samples for? Brilinta  2.  Are you currently out of this medication?  enough until Thursday- trying to get help to get this medicine

## 2023-10-29 ENCOUNTER — Ambulatory Visit: Payer: Medicare Other | Admitting: Surgery

## 2023-10-29 ENCOUNTER — Encounter (HOSPITAL_COMMUNITY): Payer: Medicare Other

## 2023-11-03 DIAGNOSIS — K122 Cellulitis and abscess of mouth: Secondary | ICD-10-CM | POA: Diagnosis not present

## 2023-11-03 DIAGNOSIS — Z6826 Body mass index (BMI) 26.0-26.9, adult: Secondary | ICD-10-CM | POA: Diagnosis not present

## 2023-11-03 DIAGNOSIS — K1120 Sialoadenitis, unspecified: Secondary | ICD-10-CM | POA: Diagnosis not present

## 2023-11-03 DIAGNOSIS — R220 Localized swelling, mass and lump, head: Secondary | ICD-10-CM | POA: Diagnosis not present

## 2023-11-12 ENCOUNTER — Ambulatory Visit (HOSPITAL_COMMUNITY)
Admission: RE | Admit: 2023-11-12 | Discharge: 2023-11-12 | Disposition: A | Discharge status: Home / Self Care | Payer: Medicare Other | Source: Ambulatory Visit | Attending: Surgery | Admitting: Surgery

## 2023-11-12 ENCOUNTER — Encounter: Payer: Self-pay | Admitting: Surgery

## 2023-11-12 ENCOUNTER — Ambulatory Visit (INDEPENDENT_AMBULATORY_CARE_PROVIDER_SITE_OTHER): Payer: Medicare Other | Admitting: Surgery

## 2023-11-12 VITALS — BP 163/93 | HR 70 | Temp 98.0°F | Resp 18 | Ht 67.0 in | Wt 173.2 lb

## 2023-11-12 DIAGNOSIS — I6523 Occlusion and stenosis of bilateral carotid arteries: Secondary | ICD-10-CM | POA: Insufficient documentation

## 2023-11-12 DIAGNOSIS — I70212 Atherosclerosis of native arteries of extremities with intermittent claudication, left leg: Secondary | ICD-10-CM

## 2023-11-12 NOTE — Progress Notes (Signed)
 Vascular and Vein Specialist of [facility]  Patient name: Dale Fowler MRN: [id number] DOB: [birth year] Sex: male   REASON FOR VISIT:    Follow up  HISOTRY OF PRESENT ILLNESS:    Dale Fowler is a 58 y.o. male who I met in August 2024 for evaluation of renovascular hypertension as well as claudication.  He had a CT scan that showed calcific disease around his renal arteries and iliac vessels.  Ultrasound of his renal arteries did not show any significant stenosis.  His ABIs were 1.0 on the right and 0.71 on the left.  He does endorse claudication symptoms on the left calf especially when mowing his yard.  His symptoms get better with rest.  At our initial visit we discussed medical management of his claudication symptoms including exercise (he was starting cardiac rehab), daily monitoring of his feet, smoking cessation, and cilostazol.  He did not want to start cilostazol at that time.  He is back today without any change in his symptoms.  He does not have rest pain or ulcers  He has a history of TIA several years ago with right arm weakness, facial drooping, and slurred speech he takes a statin for hypercholesterolemia.  He is on dual antiplatelet therapy.  He is medically managed for hypertension.  He is a former smoker   PAST MEDICAL HISTORY:   Past Medical History:  Diagnosis Date   Carotid artery occlusion    GERD (gastroesophageal reflux disease)    Hyperlipidemia    Hypertension    does not see a cardiologist   TIA (transient ischemic attack)    X2     FAMILY HISTORY:   Family History  Problem Relation Age of Onset   Hypertension Mother    Diabetes Mother     SOCIAL HISTORY:   Social History   Tobacco Use   Smoking status: Former    Current packs/day: 0.00    Average packs/day: 0.5 packs/day for 18.0 years (9.0 ttl pk-yrs)    Types: Cigarettes    Quit date: 03/2023    Years since quitting: 0.6   Smokeless tobacco: Not  on file  Substance Use Topics   Alcohol use: Yes     ALLERGIES:   Allergies  Allergen Reactions   Shellfish Allergy     FACIAL EDEMA     CURRENT MEDICATIONS:   Current Outpatient Medications  Medication Sig Dispense Refill   aspirin 81 MG chewable tablet Chew 1 tablet (81 mg total) by mouth daily. 90 tablet 2   clopidogrel (PLAVIX) 75 MG tablet (1) 300 Mg dose 12 hours after last dose of Brilinta, then reduce to 75 mg daily 35 tablet 2   metoprolol tartrate (LOPRESSOR) 25 MG tablet Take 1 tablet (25 mg total) by mouth 2 (two) times daily. 180 tablet 1   nitroGLYCERIN (NITROSTAT) 0.4 MG SL tablet Place 1 tablet (0.4 mg total) under the tongue every 5 (five) minutes as needed. 25 tablet 2   tadalafil (CIALIS) 5 MG tablet Take 1 tablet (5 mg total) by mouth daily as needed for erectile dysfunction. 30 tablet 11   atorvastatin (LIPITOR) 40 MG tablet Take 1 tablet (40 mg total) by mouth daily. 90 tablet 3   diazepam (VALIUM) 2 MG tablet Take 2 mg by mouth daily as needed for anxiety. (Patient not taking: Reported on 11/12/2023)     Evolocumab (REPATHA SURECLICK) 140 MG/ML SOAJ Inject 140 mg into the skin every 14 (fourteen) days. (Patient not taking: Reported on 11/12/2023)  6 mL 3   nicotine (NICODERM CQ - DOSED IN MG/24 HOURS) 14 mg/24hr patch Place 1 patch (14 mg total) onto the skin daily at 6 (six) AM. (Patient not taking: Reported on 11/12/2023) 28 patch 0   No current facility-administered medications for this visit.    REVIEW OF SYSTEMS:   [X]  denotes positive finding, [ ]  denotes negative finding Cardiac  Comments:  Chest pain or chest pressure:    Shortness of breath upon exertion:    Short of breath when lying flat:    Irregular heart rhythm:        Vascular    Pain in calf, thigh, or hip brought on by ambulation: x   Pain in feet at night that wakes you up from your sleep:     Blood clot in your veins:    Leg swelling:         Pulmonary    Oxygen at home:     Productive cough:     Wheezing:         Neurologic    Sudden weakness in arms or legs:     Sudden numbness in arms or legs:     Sudden onset of difficulty speaking or slurred speech:    Temporary loss of vision in one eye:     Problems with dizziness:         Gastrointestinal    Blood in stool:     Vomited blood:         Genitourinary    Burning when urinating:     Blood in urine:        Psychiatric    Major depression:         Hematologic    Bleeding problems:    Problems with blood clotting too easily:        Skin    Rashes or ulcers:        Constitutional    Fever or chills:      PHYSICAL EXAM:   Vitals:   11/12/23 1145  BP: (!) 164/100  Pulse: 70  Resp: 18  Temp: 98 F (36.7 C)  TempSrc: Temporal  SpO2: 97%  Weight: 173 lb 3.2 oz (78.6 kg)  Height: 5\' 7"  (1.702 m)    GENERAL: The patient is a well-nourished male, in no acute distress. The vital signs are documented above. CARDIAC: There is a regular rate and rhythm.  PULMONARY: Non-labored respirations MUSCULOSKELETAL: There are no major deformities or cyanosis. NEUROLOGIC: No focal weakness or paresthesias are detected. SKIN: There are no ulcers or rashes noted. PSYCHIATRIC: The patient has a normal affect.  STUDIES:   I have reviewed the following studies: Right Carotid: The extracranial vessels were near-normal with only minimal  wall                thickening or plaque.   Left Carotid: The extracranial vessels were near-normal with only minimal  wall               thickening or plaque.   MEDICAL ISSUES:   PAD: The patient's symptoms remain tolerable.  We will continue with medical management and exercise program, smoking cessation, cholesterol and blood pressure management.  He knows to contact me should he develop a wound on his foot.  We will not start cilostazol at this time.  All of his questions were answered today.  He will follow-up in 1 year with repeat ABIs  Carotid: The patient  has a history of  TIAs.  His carotid duplex was unremarkable today    Durene Cal, IV, MD, FACS [facility] and Vein Specialists of [facility] [phone] Pager [phone]

## 2023-11-14 ENCOUNTER — Telehealth: Payer: Self-pay | Admitting: *Deleted

## 2023-11-14 NOTE — Telephone Encounter (Signed)
   Name: Dale Fowler  DOB: 08-16-1966  MRN: [id number]  Primary Cardiologist: Lance Muss, MD  Chart reviewed as part of pre-operative protocol coverage. The patient has an upcoming visit scheduled with Dr. Jenene Slicker on 12/17/2023 to established care we new Cardiologist as former Cardiologist is no longer with practice, at which time clearance can be addressed in case there are any issues that would impact surgical recommendations.  5 teeth dental extraction, 2 surgical and 3 simple extractions is not scheduled at this time and is TBD as below. I added preop FYI to appointment note so that provider is aware to address at time of outpatient visit.  Per office protocol the cardiology provider should forward their finalized clearance decision and recommendations regarding antiplatelet therapy to the requesting party below.     I will route this message as FYI to requesting party and remove this message from the preop box as separate preop APP input not needed at this time.   Please call with any questions.  Denyce Robert, NP  11/14/2023, 10:31 AM

## 2023-11-14 NOTE — Telephone Encounter (Signed)
 Dr. Lyndal Rainbow office call back with clarification:    PROCEDURE: 5 TEETH FOR EXTRACTION; 2 SURGICAL AND 3 SIMPLE EXTRACTIONS  FAX# [phone]  DR. HAMEED, DMD

## 2023-11-14 NOTE — Telephone Encounter (Signed)
   Pre-operative Risk Assessment    Patient Name: Dale Fowler  DOB: 09-Mar-1966 MRN: [id number]   Date of last office visit: 06/14/23 Sharlene Dory, NP Date of next office visit: 12/17/23 DR. MALLIPEDDI  Request for Surgical Clearance    Procedure:  Dental Extraction - Amount of Teeth to be Pulled:  LEFT MESSAGE TO CALL BACK TO CLARIFY  Date of Surgery:  Clearance TBD                                Surgeon: NOT LISTED Surgeon's Group or Practice Name:  [facility] Phone number:  [phone] Fax number:  NOT LISTED; NEED FAX #   Type of Clearance Requested:   - Medical  - Pharmacy:  Hold Aspirin and Clopidogrel (Plavix)     Type of Anesthesia:  Local    Additional requests/questions:    Elpidio Anis   11/14/2023, 9:14 AM

## 2023-12-03 DIAGNOSIS — H5203 Hypermetropia, bilateral: Secondary | ICD-10-CM | POA: Diagnosis not present

## 2023-12-17 ENCOUNTER — Encounter: Payer: Self-pay | Admitting: Internal Medicine

## 2023-12-17 ENCOUNTER — Ambulatory Visit: Payer: Medicare Other | Attending: Internal Medicine | Admitting: Internal Medicine

## 2023-12-17 VITALS — BP 158/98 | HR 80 | Ht 67.0 in | Wt 174.8 lb

## 2023-12-17 DIAGNOSIS — Z01818 Encounter for other preprocedural examination: Secondary | ICD-10-CM | POA: Diagnosis not present

## 2023-12-17 DIAGNOSIS — I340 Nonrheumatic mitral (valve) insufficiency: Secondary | ICD-10-CM

## 2023-12-17 DIAGNOSIS — I2510 Atherosclerotic heart disease of native coronary artery without angina pectoris: Secondary | ICD-10-CM | POA: Diagnosis not present

## 2023-12-17 DIAGNOSIS — Z01810 Encounter for preprocedural cardiovascular examination: Secondary | ICD-10-CM | POA: Insufficient documentation

## 2023-12-17 NOTE — Progress Notes (Signed)
 Cardiology Office Note  Date: 12/17/2023   ID: Dale Fowler, DOB Dec 03, 1965, MRN [id number]  PCP:  Lawerance Sabal, PA  Cardiologist:  Lance Muss, MD Electrophysiologist:  None   History of Present Illness: Dale Fowler is a 58 y.o. male known to have CAD manifested by inferior STEMI S/P RCA PCI in July 2024 with residual OM1 60% stenosis and normal LVEF, HTN, HLD, TIA x 2 is here for follow-up visit.  Accompanied by girlfriend.  Patient is here for preop cardiac risk stratification for complex teeth extraction (2 surgical and 3 simple teeth extraction) from dental abscess.  Patient reported ongoing dental abscess for the last couple of months and stated that he had a hole in the bone due to the abscess.  No other symptoms of angina, DOE, dizziness, lightheadedness, syncope or leg swelling.  Past Medical History:  Diagnosis Date   Carotid artery occlusion    GERD (gastroesophageal reflux disease)    Hyperlipidemia    Hypertension    does not see a cardiologist   TIA (transient ischemic attack)    X2    Past Surgical History:  Procedure Laterality Date   CORONARY/GRAFT ACUTE MI REVASCULARIZATION N/A 04/04/2023   Procedure: Coronary/Graft Acute MI Revascularization;  Surgeon: Lennette Bihari, MD;  Location: [facility] INVASIVE CV LAB;  Service: Cardiovascular;  Laterality: N/A;   LEFT HEART CATH AND CORONARY ANGIOGRAPHY N/A 04/04/2023   Procedure: LEFT HEART CATH AND CORONARY ANGIOGRAPHY;  Surgeon: Lennette Bihari, MD;  Location: [facility] INVASIVE CV LAB;  Service: Cardiovascular;  Laterality: N/A;   LUMBAR LAMINECTOMY/DECOMPRESSION MICRODISCECTOMY Left 01/30/2013   Procedure: LUMBAR LAMINECTOMY/DECOMPRESSION MICRODISCECTOMY 1 LEVEL;  Surgeon: Reinaldo Meeker, MD;  Location: [facility] NEURO ORS;  Service: Neurosurgery;  Laterality: Left;  lumbar four-five    Current Outpatient Medications  Medication Sig Dispense Refill   aspirin 81 MG chewable tablet Chew 1 tablet (81 mg total) by mouth  daily. 90 tablet 2   atorvastatin (LIPITOR) 40 MG tablet Take 1 tablet (40 mg total) by mouth daily. 90 tablet 3   clopidogrel (PLAVIX) 75 MG tablet (1) 300 Mg dose 12 hours after last dose of Brilinta, then reduce to 75 mg daily 35 tablet 2   diazepam (VALIUM) 2 MG tablet Take 2 mg by mouth daily as needed for anxiety. (Patient not taking: Reported on 11/12/2023)     Evolocumab (REPATHA SURECLICK) 140 MG/ML SOAJ Inject 140 mg into the skin every 14 (fourteen) days. (Patient not taking: Reported on 12/17/2023) 6 mL 3   metoprolol tartrate (LOPRESSOR) 25 MG tablet Take 1 tablet (25 mg total) by mouth 2 (two) times daily. 180 tablet 1   nicotine (NICODERM CQ - DOSED IN MG/24 HOURS) 14 mg/24hr patch Place 1 patch (14 mg total) onto the skin daily at 6 (six) AM. (Patient not taking: Reported on 12/17/2023) 28 patch 0   nitroGLYCERIN (NITROSTAT) 0.4 MG SL tablet Place 1 tablet (0.4 mg total) under the tongue every 5 (five) minutes as needed. 25 tablet 2   tadalafil (CIALIS) 5 MG tablet Take 1 tablet (5 mg total) by mouth daily as needed for erectile dysfunction. 30 tablet 11   No current facility-administered medications for this visit.   Allergies:  Shellfish allergy   Social History: The patient  reports that he quit smoking about 9 months ago. His smoking use included cigarettes. He has a 9 pack-year smoking history. He does not have any smokeless tobacco history on file. He reports current alcohol use. He  reports current drug use. Drug: Marijuana.   Family History: The patient's family history includes Diabetes in his mother; Hypertension in his mother.   ROS:  Please see the history of present illness. Otherwise, complete review of systems is positive for none  All other systems are reviewed and negative.   Physical Exam: VS:  Ht 5\' 7"  (1.702 m)   Wt 174 lb 12.8 oz (79.3 kg)   BMI 27.38 kg/m , BMI Body mass index is 27.38 kg/m.  Wt Readings from Last 3 Encounters:  12/17/23 174 lb 12.8 oz (79.3  kg)  11/12/23 173 lb 3.2 oz (78.6 kg)  06/14/23 177 lb (80.3 kg)    General: Patient appears comfortable at rest. HEENT: Conjunctiva and lids normal, oropharynx clear with moist mucosa. Neck: Supple, no elevated JVP or carotid bruits, no thyromegaly. Lungs: Clear to auscultation, nonlabored breathing at rest. Cardiac: Regular rate and rhythm, holosystolic murmur on exam. Abdomen: Soft, nontender, no hepatomegaly, bowel sounds present, no guarding or rebound. Extremities: No pitting edema, distal pulses 2+. Skin: Warm and dry. Musculoskeletal: No kyphosis. Neuropsychiatric: Alert and oriented x3, affect grossly appropriate.  Recent Labwork: 04/05/2023: BUN 8; Creatinine, Ser 1.17; Hemoglobin 12.8; Platelets 291; Potassium 3.6; Sodium 137     Component Value Date/Time   CHOL 65 (L) 07/13/2023 1011   TRIG 49 07/13/2023 1011   HDL 40 07/13/2023 1011   CHOLHDL 1.6 07/13/2023 1011   CHOLHDL 5.1 04/04/2023 0616   VLDL 13 04/04/2023 0616   LDLCALC 12 07/13/2023 1011     Assessment and Plan:  Preop cardiac risk stratification for complex teeth extraction (2 surgical and 3 simple) under general anesthesia: No angina or DOE.  Last PCI to RCA was in July 2024 from inferior STEMI. Echo showed normal LVEF, G2 DD, mild to moderate MR. Compliant with DAPT, Brilinta switched to Plavix due to cost issues. Complex teeth extraction is considered to be a low bleeding risk procedure (<2% risk of major bleeding) for which DAPT does not have to be interrupted. Local hemostatic measures will need to be applied during the procedure.  CAD manifested by inferior STEMI in July 2024 s/p RCA PCI with residual OM1 60% stenosis and normal LVEF: No interval angina or DOE.  Currently on DAPT.  Brilinta switched to Plavix in the past due to cost issues.  Continue atorvastatin 40 mg nightly and Repatha.  Continue metoprolol titrate 25 mg twice daily.  Quit smoking at the time of MI.  HLD, at goal: LDL 12 in November  2024.  Continue atorvastatin 40 mg nightly and Repatha.  Mild to moderate MR: Will repeat echocardiogram in 6 months before the next clinic visit.  Systolic murmur on exam.  PAD: ABI 0.7 in LLE.  Follows up with vascular.  Continue cardioprotective medications as stated above.  Quit smoking at the time of MI.       Medication Adjustments/Labs and Tests Ordered: Current medicines are reviewed at length with the patient today.  Concerns regarding medicines are outlined above.    Disposition:  Follow up  6 months  Signed Paisli Silfies Verne Spurr, MD, 12/17/2023 12:05 PM    [facility] at [facility] [address] [place], [place], [place] [address]

## 2023-12-17 NOTE — Patient Instructions (Signed)
 Medication Instructions:  Your physician recommends that you continue on your current medications as directed. Please refer to the Current Medication list given to you today.   Labwork: None  Testing/Procedures: Your physician has requested that you have an echocardiogram in 6 months. Echocardiography is a painless test that uses sound waves to create images of your heart. It provides your doctor with information about the size and shape of your heart and how well your heart's chambers and valves are working. This procedure takes approximately one hour. There are no restrictions for this procedure. Please do NOT wear cologne, perfume, aftershave, or lotions (deodorant is allowed). Please arrive 15 minutes prior to your appointment time.  Please note: We ask at that you not bring children with you during ultrasound (echo/ vascular) testing. Due to room size and safety concerns, children are not allowed in the ultrasound rooms during exams. Our front office staff cannot provide observation of children in our lobby area while testing is being conducted. An adult accompanying a patient to their appointment will only be allowed in the ultrasound room at the discretion of the ultrasound technician under special circumstances. We apologize for any inconvenience.   Follow-Up: Your physician recommends that you schedule a follow-up appointment in: 6 months  Any Other Special Instructions Will Be Listed Below (If Applicable). Thank you for choosing [facility]!     If you need a refill on your cardiac medications before your next appointment, please call your pharmacy.

## 2023-12-27 DIAGNOSIS — E7849 Other hyperlipidemia: Secondary | ICD-10-CM | POA: Diagnosis not present

## 2023-12-27 DIAGNOSIS — I10 Essential (primary) hypertension: Secondary | ICD-10-CM | POA: Diagnosis not present

## 2023-12-27 DIAGNOSIS — M545 Low back pain, unspecified: Secondary | ICD-10-CM | POA: Diagnosis not present

## 2023-12-27 DIAGNOSIS — Z0001 Encounter for general adult medical examination with abnormal findings: Secondary | ICD-10-CM | POA: Diagnosis not present

## 2023-12-27 DIAGNOSIS — K5900 Constipation, unspecified: Secondary | ICD-10-CM | POA: Diagnosis not present

## 2023-12-27 DIAGNOSIS — M62830 Muscle spasm of back: Secondary | ICD-10-CM | POA: Diagnosis not present

## 2023-12-27 DIAGNOSIS — Z1329 Encounter for screening for other suspected endocrine disorder: Secondary | ICD-10-CM | POA: Diagnosis not present

## 2023-12-27 DIAGNOSIS — Z6826 Body mass index (BMI) 26.0-26.9, adult: Secondary | ICD-10-CM | POA: Diagnosis not present

## 2024-01-02 NOTE — Telephone Encounter (Signed)
 Per patients girlfriend Tinnelle, patient will need to hold his blood thinner in order for dentist to do his extractions. She states that the patient was given mixed information regarding this. She said that one nurse informed him that he could hold blood thinner and then another nurse told him that he cannot hold his blood thinner. Please advise.  Thanks,  Darbi Chandran

## 2024-01-02 NOTE — Telephone Encounter (Signed)
 Pt's girlfriend Friddie Jetty is requesting a callback at [phone] regarding her and pt needing clarity on clearance outcome. She stated pt would still like to have his teeth pulled and to stop his blood thinners so that he can have the teeth that they actually can pull be pulled. Please advise

## 2024-01-03 NOTE — Telephone Encounter (Signed)
     Primary Cardiologist: Avery Bodo, MD  Chart reviewed as part of pre-operative protocol coverage. Given past medical history and time since last visit, based on ACC/AHA guidelines, Dale Fowler would be at acceptable risk for the planned procedure without further cardiovascular testing.   His aspirin  and Plavix  should be continued throughout his procedure.  He does not require SBE prophylaxis.  I will route this recommendation to the requesting party via Epic fax function and remove from pre-op pool.  Please call with questions.  Chet Cota. Carolynne Schuchard NP-C     01/03/2024, 3:55 PM [facility] [address] Office [phone] Fax [phone]

## 2024-01-24 ENCOUNTER — Other Ambulatory Visit: Payer: Self-pay | Admitting: Nurse Practitioner

## 2024-02-11 ENCOUNTER — Other Ambulatory Visit: Payer: Self-pay | Admitting: Urology

## 2024-02-11 DIAGNOSIS — N5201 Erectile dysfunction due to arterial insufficiency: Secondary | ICD-10-CM

## 2024-02-12 ENCOUNTER — Ambulatory Visit: Admitting: Urology

## 2024-02-12 ENCOUNTER — Encounter: Payer: Self-pay | Admitting: Urology

## 2024-02-12 VITALS — BP 168/95 | HR 76 | Temp 97.9°F

## 2024-02-12 DIAGNOSIS — N5201 Erectile dysfunction due to arterial insufficiency: Secondary | ICD-10-CM | POA: Insufficient documentation

## 2024-02-12 DIAGNOSIS — N138 Other obstructive and reflux uropathy: Secondary | ICD-10-CM | POA: Diagnosis not present

## 2024-02-12 DIAGNOSIS — R3915 Urgency of urination: Secondary | ICD-10-CM | POA: Insufficient documentation

## 2024-02-12 DIAGNOSIS — N401 Enlarged prostate with lower urinary tract symptoms: Secondary | ICD-10-CM | POA: Insufficient documentation

## 2024-02-12 DIAGNOSIS — R972 Elevated prostate specific antigen [PSA]: Secondary | ICD-10-CM | POA: Diagnosis not present

## 2024-02-12 LAB — BLADDER SCAN AMB NON-IMAGING: Scan Result: 13

## 2024-02-12 MED ORDER — TADALAFIL 5 MG PO TABS
5.0000 mg | ORAL_TABLET | Freq: Every day | ORAL | 3 refills | Status: AC
Start: 2024-02-12 — End: ?

## 2024-02-12 NOTE — Progress Notes (Signed)
 Name: Dale Fowler DOB: 01/25/1966 MRN: [id number]  History of Present Illness: [name] is a 58 y.o. male who presents today at [facility].  GU History includes: 1. BPH with BOO & LUTS (nocturia and urgency). - No prior GU surgery. 2. Elevated PSA (resolved). - He denies known family history of prostate cancer. 3. Microhematuria.   - 12/07/2022: CT showed no GU stones, masses, or hydronephrosis; bladder unremarkable. Small right renal cyst.   - 12/28/2022: Cystoscopy negative for evidence of malignancy. 4. Erectile dysfunction.  PSA values:  Lab Results  Component Value Date   PSA1 0.9 10/05/2022  Outside PSA values:  > November 2023 = 3.5  At last visit with Dr. Inga Manges on 12/28/2022: - Cystoscopy performed. Per note: "The urethral is normal. The prostate is short with minimal hyperplasia but a tight bladder neck. The bladder wall has mild trabeculation without mucosal lesions." - The plan was to start Cialis  (Tadalafil ) 5 mg daily for BPH and ED.  Since last visit: > 04/04/2023: Admitted for STEMI and underwent PCI. Has been followed closely by cardiology since then. His ongoing Cialis  use was approved per cardiology note on 06/14/2023: "Discussed to avoid taking NTG and Cialis  within 48 hours of one another, verbalized understanding."  Today: He denies urinary urgency, frequency, dysuria, gross hematuria, weak urinary stream, hesitancy, straining to void, or sensations of incomplete emptying.  He reports that the Cialis  5 mg daily is helping him to achieve erections but they don't always last long enough.    Medications: Current Outpatient Medications  Medication Sig Dispense Refill   aspirin  81 MG chewable tablet Chew 1 tablet (81 mg total) by mouth daily. 90 tablet 2   atorvastatin  (LIPITOR) 40 MG tablet Take 1 tablet (40 mg total) by mouth daily. 90 tablet 3   clopidogrel  (PLAVIX ) 75 MG tablet Take 1 tablet (75 mg total) by mouth daily. 90  tablet 3   diazepam  (VALIUM ) 2 MG tablet Take 2 mg by mouth daily as needed for anxiety. (Patient not taking: Reported on 11/12/2023)     Evolocumab  (REPATHA  SURECLICK) 140 MG/ML SOAJ Inject 140 mg into the skin every 14 (fourteen) days. (Patient not taking: Reported on 12/17/2023) 6 mL 3   metoprolol  tartrate (LOPRESSOR ) 25 MG tablet Take 1 tablet (25 mg total) by mouth 2 (two) times daily. 180 tablet 1   nicotine  (NICODERM CQ  - DOSED IN MG/24 HOURS) 14 mg/24hr patch Place 1 patch (14 mg total) onto the skin daily at 6 (six) AM. (Patient not taking: Reported on 12/17/2023) 28 patch 0   nitroGLYCERIN  (NITROSTAT ) 0.4 MG SL tablet Place 1 tablet (0.4 mg total) under the tongue every 5 (five) minutes as needed. 25 tablet 2   tadalafil  (CIALIS ) 5 MG tablet Take 1 tablet (5 mg total) by mouth daily. 90 tablet 3   No current facility-administered medications for this visit.    Allergies: Allergies  Allergen Reactions   Shellfish Allergy     FACIAL EDEMA    Past Medical History:  Diagnosis Date   Carotid artery occlusion    GERD (gastroesophageal reflux disease)    Hyperlipidemia    Hypertension    does not see a cardiologist   TIA (transient ischemic attack)    X2   Past Surgical History:  Procedure Laterality Date   CORONARY/GRAFT ACUTE MI REVASCULARIZATION N/A 04/04/2023   Procedure: Coronary/Graft Acute MI Revascularization;  Surgeon: Millicent Ally, MD;  [facility];  Service:  Cardiovascular;  Laterality: N/A;   LEFT HEART CATH AND CORONARY ANGIOGRAPHY N/A 04/04/2023   Procedure: LEFT HEART CATH AND CORONARY ANGIOGRAPHY;  Surgeon: Millicent Ally, MD;  Location: [facility];  Service: Cardiovascular;  Laterality: N/A;   LUMBAR LAMINECTOMY/DECOMPRESSION MICRODISCECTOMY Left 01/30/2013   Procedure: LUMBAR LAMINECTOMY/DECOMPRESSION MICRODISCECTOMY 1 LEVEL;  Surgeon: Augustine Blocker, MD;  Location: [facility]URO [facility];  Service: Neurosurgery;  Laterality: Left;  lumbar four-five    Family History  Problem Relation Age of Onset   Hypertension Mother    Diabetes Mother    Social History   Socioeconomic History   Marital status: Divorced    Spouse name: Not on file   Number of children: Not on file   Years of education: Not on file   Highest education level: Not on file  Occupational History   Not on file  Tobacco Use   Smoking status: Former    Current packs/day: 0.00    Average packs/day: 0.5 packs/day for 18.0 years (9.0 ttl pk-yrs)    Types: Cigarettes    Quit date: 03/2023    Years since quitting: 0.9   Smokeless tobacco: Not on file  Vaping Use   Vaping status: Never Used  Substance and Sexual Activity   Alcohol  use: Yes   Drug use: Yes    Types: Marijuana   Sexual activity: Not on file  Other Topics Concern   Not on file  Social History Narrative   Not on file   Social Drivers of Health   Financial Resource Strain: Not on file  Food Insecurity: No Food Insecurity (04/04/2023)   Hunger Vital Sign    Worried About Running Out of Food in the Last Year: Never true    Ran Out of Food in the Last Year: Never true  Transportation Needs: No Transportation Needs (04/04/2023)   PRAPARE - Administrator, Civil Service (Medical): No    Lack of Transportation (Non-Medical): No  Physical Activity: Not on file  Stress: Not on file  Social Connections: Not on file  Intimate Partner Violence: Patient Unable To Answer (04/05/2023)   Humiliation, Afraid, Rape, and Kick questionnaire    Fear of Current or Ex-Partner: Patient unable to answer    Emotionally Abused: Patient unable to answer    Physically Abused: Patient unable to answer    Sexually Abused: Patient unable to answer    Review of Systems Constitutional: Patient denies any unintentional weight loss or change in strength lntegumentary: Patient denies any rashes or pruritus Cardiovascular: Patient denies chest pain or syncope Respiratory: Patient denies shortness of  breath Gastrointestinal: Patient denies nausea, vomiting, constipation, diarrhea Musculoskeletal: Patient denies muscle cramps or weakness Neurologic: Patient denies convulsions or seizures Allergic/Immunologic: Patient denies recent allergic reaction(s) Hematologic/Lymphatic: Patient denies bleeding tendencies Endocrine: Patient denies heat/cold intolerance  GU: As per HPI.  OBJECTIVE Vitals:   02/12/24 1602  BP: (!) 168/95  Pulse: 76  Temp: 97.9 F (36.6 C)   There is no height or weight on file to calculate BMI.  Physical Examination Constitutional: No obvious distress; patient is non-toxic appearing  Cardiovascular: No visible lower extremity edema.  Respiratory: The patient does not have audible wheezing/stridor; respirations do not appear labored  Gastrointestinal: Abdomen non-distended Musculoskeletal: Normal ROM of UEs  Skin: No obvious rashes/open sores  Neurologic: CN 2-12 grossly intact Psychiatric: Answered questions appropriately with normal affect  Hematologic/Lymphatic/Immunologic: No obvious bruises or sites of spontaneous bleeding   ASSESSMENT BPH with urinary obstruction -  Plan: BLADDER SCAN AMB NON-IMAGING, tadalafil  (CIALIS ) 5 MG tablet  Elevated PSA  Erectile dysfunction due to arterial insufficiency - Plan: tadalafil  (CIALIS ) 5 MG tablet  Urgency of urination  BPH well managed on Cialis  5 mg daily.   Offered PSA test today for prostate cancer screening; patient declined.  Thorough discussion was had with patient regarding possible etiologies of erectile dysfunction including psychological, vasculogenic, neurologic, and testosterone  deficiency.   We discussed option to check testosterone  levels; patient declined.   We reviewed prior cardiology note - okay to continue Cialis  5 mg daily. He verbalized understanding about the risks of this medication and awareness not to take it at same time as nitroglycerin .   Discussed option to consider Cialis   20 mg PRN on top of the Cialis  5 mg daily - would require cardiology clearance first. Also discussed option to use a vacuum erection device. He elected to continue on current treatment regimen with Cialis  5 mg daily.   Refill sent. We agreed to plan for follow up in 1 year or sooner if needed. Patient verbalized understanding of and agreement with current plan. All questions were answered.  PLAN Advised the following: 1. Continue Cialis  (Tadalafil ) 5 mg daily. 2. Return in about 1 year (around 02/11/2025) for UA, PVR, & f/u with Griselda Lederer NP.  Orders Placed This Encounter  Procedures   BLADDER SCAN AMB NON-IMAGING    It has been explained that the patient is to follow regularly with their PCP in addition to all other providers involved in their care and to follow instructions provided by these respective offices. Patient advised to contact urology clinic if any urologic-pertaining questions, concerns, new symptoms or problems arise in the interim period.  There are no Patient Instructions on file for this visit.  Electronically signed by:  Lauretta Ponto, FNP   02/12/24    4:55 PM

## 2024-02-12 NOTE — Progress Notes (Signed)
post void residual=13 

## 2024-02-12 NOTE — Telephone Encounter (Signed)
 Called pt spoke w/ girlfriend relayed a message from MD wrenn stating pt can have Rx refill but should see Isa Manuel for f/u advised pt that a refill would be sent to his phramcy but it would only be enough for the month pt significant other voiced her understanding stating pt has been out for a few days

## 2024-05-10 DIAGNOSIS — N472 Paraphimosis: Secondary | ICD-10-CM | POA: Diagnosis not present

## 2024-05-10 DIAGNOSIS — Z6825 Body mass index (BMI) 25.0-25.9, adult: Secondary | ICD-10-CM | POA: Diagnosis not present

## 2024-06-06 DIAGNOSIS — N471 Phimosis: Secondary | ICD-10-CM | POA: Diagnosis not present

## 2024-06-06 DIAGNOSIS — N481 Balanitis: Secondary | ICD-10-CM | POA: Diagnosis not present

## 2024-06-09 ENCOUNTER — Telehealth: Payer: Self-pay | Admitting: Internal Medicine

## 2024-06-09 ENCOUNTER — Other Ambulatory Visit: Payer: Self-pay | Admitting: Urology

## 2024-06-09 ENCOUNTER — Telehealth: Payer: Self-pay

## 2024-06-09 NOTE — Telephone Encounter (Signed)
 Preop tele appt now scheduled, med rec and consent done.

## 2024-06-09 NOTE — Telephone Encounter (Signed)
 Primary Cardiologist:Vishnu P Mallipeddi, MD   Preoperative team, please contact this patient and set up a phone call appointment for further preoperative risk assessment. Please obtain consent and complete medication review. Thank you for your help.   I confirm that guidance regarding antiplatelet and oral anticoagulation therapy has been completed and, if necessary, noted below.  I also confirmed the patient resides in the state of [place] . As per [facility] Medical Board telemedicine laws, the patient must reside in the state in which the provider is licensed.   Rosaline EMERSON Bane, NP-C  06/09/2024, 12:34 PM [address] [place], [place] [address] Office [phone] Fax [phone]

## 2024-06-09 NOTE — Telephone Encounter (Signed)
   [facility] Pre-operative Risk Assessment    Request for surgical clearance:  What type of surgery is being performed?  Circumcision   When is this surgery scheduled?  07/17/24   What type of clearance is required (medical clearance vs. Pharmacy clearance to hold med vs. Both)?  Both   Are there any medications that need to be held prior to surgery and how long? Plavix  + Aspirin  5 days   Practice name and name of physician performing surgery?   [facility]  Dr. Watt  What is your office phone number? [phone] (ext#: [phone] )   7.   What is your office fax number? [phone]  8.   Anesthesia type (None, local, MAC, general)?  General    Dale Fowler 06/09/2024, 11:50 AM

## 2024-06-09 NOTE — Telephone Encounter (Signed)
  Patient Consent for Virtual Visit        Dale Fowler has provided verbal consent on 06/09/2024 for a virtual visit (video or telephone).   CONSENT FOR VIRTUAL VISIT FOR:  Dale Fowler  By participating in this virtual visit I agree to the following:  I hereby voluntarily request, consent and authorize [facility] and its employed or contracted physicians, physician assistants, nurse practitioners or other licensed health care professionals (the Practitioner), to provide me with telemedicine health care services (the "Services) as deemed necessary by the treating Practitioner. I acknowledge and consent to receive the Services by the Practitioner via telemedicine. I understand that the telemedicine visit will involve communicating with the Practitioner through live audiovisual communication technology and the disclosure of certain medical information by electronic transmission. I acknowledge that I have been given the opportunity to request an in-person assessment or other available alternative prior to the telemedicine visit and am voluntarily participating in the telemedicine visit.  I understand that I have the right to withhold or withdraw my consent to the use of telemedicine in the course of my care at any time, without affecting my right to future care or treatment, and that the Practitioner or I may terminate the telemedicine visit at any time. I understand that I have the right to inspect all information obtained and/or recorded in the course of the telemedicine visit and may receive copies of available information for a reasonable fee.  I understand that some of the potential risks of receiving the Services via telemedicine include:  Delay or interruption in medical evaluation due to technological equipment failure or disruption; Information transmitted may not be sufficient (e.g. poor resolution of images) to allow for appropriate medical decision making by the  Practitioner; and/or  In rare instances, security protocols could fail, causing a breach of personal health information.  Furthermore, I acknowledge that it is my responsibility to provide information about my medical history, conditions and care that is complete and accurate to the best of my ability. I acknowledge that Practitioner's advice, recommendations, and/or decision may be based on factors not within their control, such as incomplete or inaccurate data provided by me or distortions of diagnostic images or specimens that may result from electronic transmissions. I understand that the practice of medicine is not an exact science and that Practitioner makes no warranties or guarantees regarding treatment outcomes. I acknowledge that a copy of this consent can be made available to me via my patient portal [facility] MyChart), or I can request a printed copy by calling the office of [facility].    I understand that my insurance will be billed for this visit.   I have read or had this consent read to me. I understand the contents of this consent, which adequately explains the benefits and risks of the Services being provided via telemedicine.  I have been provided ample opportunity to ask questions regarding this consent and the Services and have had my questions answered to my satisfaction. I give my informed consent for the services to be provided through the use of telemedicine in my medical care

## 2024-06-16 ENCOUNTER — Other Ambulatory Visit

## 2024-06-17 ENCOUNTER — Ambulatory Visit: Attending: Internal Medicine

## 2024-06-17 DIAGNOSIS — I340 Nonrheumatic mitral (valve) insufficiency: Secondary | ICD-10-CM

## 2024-06-17 LAB — ECHOCARDIOGRAM COMPLETE
AR max vel: 1.73 cm2
AV Peak grad: 6.1 mmHg
Ao pk vel: 1.23 m/s
Area-P 1/2: 2.69 cm2
Est EF: 55
MV M vel: 6.09 m/s
MV Peak grad: 148.4 mmHg
S' Lateral: 3.7 cm
Single Plane A2C EF: 50.4 %

## 2024-06-20 ENCOUNTER — Ambulatory Visit: Payer: Self-pay | Admitting: Internal Medicine

## 2024-06-30 ENCOUNTER — Ambulatory Visit: Attending: Cardiovascular Disease

## 2024-06-30 DIAGNOSIS — Z01810 Encounter for preprocedural cardiovascular examination: Secondary | ICD-10-CM | POA: Diagnosis not present

## 2024-06-30 NOTE — Progress Notes (Signed)
 Virtual Visit via Telephone Note   Because of Dale Fowler co-morbid illnesses, he is at least at moderate risk for complications without adequate follow up.  This format is felt to be most appropriate for this patient at this time.  Due to technical limitations with video connection (technology), today's appointment will be conducted as an audio only telehealth visit, and [name] verbally agreed to proceed in this manner.   All issues noted in this document were discussed and addressed.  No physical exam could be performed with this format.  Evaluation Performed:  Preoperative cardiovascular risk assessment _____________   Date:  06/30/2024   Patient ID:  Dale Fowler, DOB June 25, 1966, MRN [id number] Patient Location:  Home Provider location:   Office  Primary Care Provider:  Job Bolt, [place] Primary Cardiologist:  Diannah SHAUNNA Maywood, MD  Chief Complaint / Patient Profile  58 y.o. y/o male with a h/o CAD s/p inferior STEMI s/p RCA PCI in July 2024 with residual OM1 60% stenosis and normal LVEF, hypertension, hyperlipidemia, TIA x 2 who is pending circumcision and presents today for telephonic preoperative cardiovascular risk assessment. History of Present Illness  Dale Fowler is a 58 y.o. male who presents via audio/video conferencing for a telehealth visit today.  Pt was last seen in cardiology clinic on 12/17/2023 by Dr. Mallipeddi.  At that time Dale Fowler was doing well.  The patient is now pending procedure as outlined above. Since his last visit, he has remained stable from a cardiac standpoint. Today denies chest pain, shortness of breath, lower extremity edema, fatigue, palpitations, melena, hematuria, hemoptysis, diaphoresis, weakness, presyncope, syncope, orthopnea, and PND. He is able to achieve greater than 4 METs of activity with walking, household chores and yard work, patient regularly uses a push lawnmower and tolerates well. Past Medical History     Past Medical History:  Diagnosis Date   Carotid artery occlusion    GERD (gastroesophageal reflux disease)    Hyperlipidemia    Hypertension    does not see a cardiologist   TIA (transient ischemic attack)    X2   Past Surgical History:  Procedure Laterality Date   CORONARY/GRAFT ACUTE MI REVASCULARIZATION N/A 04/04/2023   Procedure: Coronary/Graft Acute MI Revascularization;  Surgeon: Burnard Debby LABOR, MD;  Location: [facility];  Service: Cardiovascular;  Laterality: N/A;   LEFT HEART CATH AND CORONARY ANGIOGRAPHY N/A 04/04/2023   Procedure: LEFT HEART CATH AND CORONARY ANGIOGRAPHY;  Surgeon: Burnard Debby LABOR, MD;  Location: [facility];  Service: Cardiovascular;  Laterality: N/A;   LUMBAR LAMINECTOMY/DECOMPRESSION MICRODISCECTOMY Left 01/30/2013   Procedure: LUMBAR LAMINECTOMY/DECOMPRESSION MICRODISCECTOMY 1 LEVEL;  Surgeon: Darina MALVA Boehringer, MD;  Location: [facility];  Service: Neurosurgery;  Laterality: Left;  lumbar four-five   Allergies Allergies  Allergen Reactions   Shellfish Allergy     FACIAL EDEMA   Home Medications    Prior to Admission medications   Medication Sig Start Date End Date Taking? Authorizing Provider  aspirin  81 MG chewable tablet Chew 1 tablet (81 mg total) by mouth daily. 04/06/23   Henry Manuelita NOVAK, NP  atorvastatin  (LIPITOR) 40 MG tablet Take 1 tablet (40 mg total) by mouth daily. 07/23/23 12/16/24  Mallipeddi, Vishnu P, MD  clopidogrel  (PLAVIX ) 75 MG tablet Take 1 tablet (75 mg total) by mouth daily. 01/24/24   Mallipeddi, Vishnu P, MD  diazepam  (VALIUM ) 2 MG tablet Take 2 mg by mouth daily as needed for anxiety. 01/12/23   [provider]  Evolocumab  (REPATHA  SURECLICK) 140 MG/ML SOAJ Inject 140 mg into the skin every 14 (fourteen) days. Patient not taking: Reported on 06/09/2024 05/02/23   Dann Candyce RAMAN, MD  metoprolol  tartrate (LOPRESSOR ) 25 MG tablet Take 1 tablet (25 mg total) by mouth 2 (two) times daily. 06/14/23   Miriam Norris, NP  nicotine  (NICODERM CQ  - DOSED IN MG/24 HOURS) 14 mg/24hr patch Place 1 patch (14 mg total) onto the skin daily at 6 (six) AM. 04/06/23   Henry Manuelita NOVAK, NP  nitroGLYCERIN  (NITROSTAT ) 0.4 MG SL tablet Place 1 tablet (0.4 mg total) under the tongue every 5 (five) minutes as needed. 04/05/23   Henry Manuelita NOVAK, NP  tadalafil  (CIALIS ) 5 MG tablet Take 1 tablet (5 mg total) by mouth daily. 02/12/24   Gerldine Lauraine BROCKS, FNP   Physical Exam  Vital Signs:  [name] does not have vital signs available for review today. Given telephonic nature of communication, physical exam is limited. AAOx3. NAD. Normal affect.  Speech and respirations are unlabored. Accessory Clinical Findings   None Assessment & Plan   1.  Preoperative Cardiovascular Risk Assessment: Mr. Darko perioperative risk of a major cardiac event is 6.6% according to the Revised Cardiac Risk Index (RCRI).  Therefore, he is at high risk for perioperative complications.   His functional capacity is good at 6.27 METs according to the Duke Activity Status Index (DASI). Recommendations: According to ACC/AHA guidelines, no further cardiovascular testing needed.  The patient may proceed to surgery at acceptable risk.   Antiplatelet and/or Anticoagulation Recommendations: Per Dr. Mallipeddi patient may hold aspirin  and Plavix  for 7 days prior to procedure, please resume Plavix  as soon as safe to do so from a bleeding standpoint.  As patient has completed 1 year of DAPT he does not need to resume aspirin .  The patient was advised that if he develops new symptoms prior to surgery to contact our office to arrange for a follow-up visit, and he verbalized understanding.  A copy of this note will be routed to requesting surgeon.  Time:   Today, I have spent 10 minutes with the patient with telehealth technology discussing medical history, symptoms, and management plan.    Janaya Broy D Juaquina Machnik, NP  06/30/2024, 2:50 PM

## 2024-07-02 ENCOUNTER — Telehealth: Payer: Self-pay | Admitting: Internal Medicine

## 2024-07-02 DIAGNOSIS — Z1331 Encounter for screening for depression: Secondary | ICD-10-CM | POA: Diagnosis not present

## 2024-07-02 DIAGNOSIS — Z1389 Encounter for screening for other disorder: Secondary | ICD-10-CM | POA: Diagnosis not present

## 2024-07-02 DIAGNOSIS — Z6825 Body mass index (BMI) 25.0-25.9, adult: Secondary | ICD-10-CM | POA: Diagnosis not present

## 2024-07-02 DIAGNOSIS — Z0001 Encounter for general adult medical examination with abnormal findings: Secondary | ICD-10-CM | POA: Diagnosis not present

## 2024-07-02 NOTE — Telephone Encounter (Signed)
  Patient Consent for Virtual Visit        Dale Fowler has provided verbal consent on 07/03/2024 for a virtual visit (video or telephone).   CONSENT FOR VIRTUAL VISIT FOR:  Dale Fowler  By participating in this virtual visit I agree to the following:  I hereby voluntarily request, consent and authorize [facility] and its employed or contracted physicians, physician assistants, nurse practitioners or other licensed health care professionals (the Practitioner), to provide me with telemedicine health care services (the "Services) as deemed necessary by the treating Practitioner. I acknowledge and consent to receive the Services by the Practitioner via telemedicine. I understand that the telemedicine visit will involve communicating with the Practitioner through live audiovisual communication technology and the disclosure of certain medical information by electronic transmission. I acknowledge that I have been given the opportunity to request an in-person assessment or other available alternative prior to the telemedicine visit and am voluntarily participating in the telemedicine visit.  I understand that I have the right to withhold or withdraw my consent to the use of telemedicine in the course of my care at any time, without affecting my right to future care or treatment, and that the Practitioner or I may terminate the telemedicine visit at any time. I understand that I have the right to inspect all information obtained and/or recorded in the course of the telemedicine visit and may receive copies of available information for a reasonable fee.  I understand that some of the potential risks of receiving the Services via telemedicine include:  Delay or interruption in medical evaluation due to technological equipment failure or disruption; Information transmitted may not be sufficient (e.g. poor resolution of images) to allow for appropriate medical decision making by the  Practitioner; and/or  In rare instances, security protocols could fail, causing a breach of personal health information.  Furthermore, I acknowledge that it is my responsibility to provide information about my medical history, conditions and care that is complete and accurate to the best of my ability. I acknowledge that Practitioner's advice, recommendations, and/or decision may be based on factors not within their control, such as incomplete or inaccurate data provided by me or distortions of diagnostic images or specimens that may result from electronic transmissions. I understand that the practice of medicine is not an exact science and that Practitioner makes no warranties or guarantees regarding treatment outcomes. I acknowledge that a copy of this consent can be made available to me via my patient portal [facility] MyChart), or I can request a printed copy by calling the office of [facility].    I understand that my insurance will be billed for this visit.   I have read or had this consent read to me. I understand the contents of this consent, which adequately explains the benefits and risks of the Services being provided via telemedicine.  I have been provided ample opportunity to ask questions regarding this consent and the Services and have had my questions answered to my satisfaction. I give my informed consent for the services to be provided through the use of telemedicine in my medical care

## 2024-07-03 ENCOUNTER — Encounter: Payer: Self-pay | Admitting: Internal Medicine

## 2024-07-03 ENCOUNTER — Ambulatory Visit: Admitting: Internal Medicine

## 2024-07-03 ENCOUNTER — Ambulatory Visit: Attending: Internal Medicine | Admitting: Internal Medicine

## 2024-07-03 VITALS — BP 164/115 | HR 65 | Ht 68.0 in | Wt 170.0 lb

## 2024-07-03 DIAGNOSIS — Z79899 Other long term (current) drug therapy: Secondary | ICD-10-CM

## 2024-07-03 DIAGNOSIS — M545 Low back pain, unspecified: Secondary | ICD-10-CM | POA: Diagnosis not present

## 2024-07-03 DIAGNOSIS — I249 Acute ischemic heart disease, unspecified: Secondary | ICD-10-CM | POA: Diagnosis not present

## 2024-07-03 DIAGNOSIS — I10 Essential (primary) hypertension: Secondary | ICD-10-CM | POA: Diagnosis not present

## 2024-07-03 DIAGNOSIS — Z6826 Body mass index (BMI) 26.0-26.9, adult: Secondary | ICD-10-CM | POA: Diagnosis not present

## 2024-07-03 MED ORDER — CARVEDILOL 3.125 MG PO TABS: 3.1250 mg | ORAL_TABLET | Freq: Two times a day (BID) | ORAL | 1 refills | Status: DC

## 2024-07-03 MED ORDER — LOSARTAN POTASSIUM 25 MG PO TABS: 25.0000 mg | ORAL_TABLET | Freq: Every day | ORAL | 1 refills | Status: AC

## 2024-07-03 NOTE — Patient Instructions (Addendum)
 Medication Instructions:  Your physician has recommended you make the following change in your medication:  Stop metoprolol  tartrate Start carvedilol 3.125 mg twice daily Start losartan 25 mg daily Continue all other medications as prescribed  Labwork: BMET in 5 days around 07/08/2024 Non-fasting [facility] ([address] [place]) or [facility]  Testing/Procedures: none  Follow-Up: Your physician recommends that you schedule a follow-up appointment in: as planned on 08/29/2024 @11 :20 am.  Any Other Special Instructions Will Be Listed Below (If Applicable).  If you need a refill on your cardiac medications before your next appointment, please call your pharmacy.

## 2024-07-03 NOTE — Progress Notes (Signed)
 Virtual Visit via Telephone Note   Because of Yareth Moris's co-morbid illnesses, he is at least at moderate risk for complications without adequate follow up.  This format is felt to be most appropriate for this patient at this time.  The patient did not have access to video technology/had technical difficulties with video requiring transitioning to audio format only (telephone).  All issues noted in this document were discussed and addressed.  No physical exam could be performed with this format.  Please refer to the patient's chart for his consent to telehealth for [facility].    Date:  07/08/2024   ID:  Dale Fowler, DOB [birth year], MRN [id number] The patient was identified using 2 identifiers.  Patient Location: Home Provider Location: Office/Clinic   PCP:  Job Bolt, PA   [facility] Providers Cardiologist:  Diannah SHAUNNA Maywood, MD     Evaluation Performed:  Follow-Up Visit  Chief Complaint:  To discuss results  History of Present Illness:    Dale Fowler is a 58 y.o. male   PMH: CAD manifested by inferior STEMI S/P RCA PCI in July 2024 with residual OM1 60% stenosis and normal LVEF, HTN, HLD, TIA x 2   I reviewed and discussed echocardiogram results with the patient today.  I answered all his questions.  Echocardiogram in October 2025 showed normal LVEF with RWMA, normal RV function, moderate MR, posteriorly directed (posterior mitral valve leaflet is mildly restricted), CVP 3 mmHg.  He does not have any symptoms of angina/DOE.  No dizziness, syncope, palpitations.  His girlfriend reports elevated blood pressures at home, around 160 mmHg SBP.   Past Medical History:  Diagnosis Date   Carotid artery occlusion    GERD (gastroesophageal reflux disease)    Hyperlipidemia    Hypertension    does not see a cardiologist   TIA (transient ischemic attack)    X2   Past Surgical History:  Procedure Laterality Date   CORONARY/GRAFT  ACUTE MI REVASCULARIZATION N/A 04/04/2023   Procedure: Coronary/Graft Acute MI Revascularization;  Surgeon: Burnard Debby LABOR, MD;  Location: [facility] INVASIVE CV LAB;  Service: Cardiovascular;  Laterality: N/A;   LEFT HEART CATH AND CORONARY ANGIOGRAPHY N/A 04/04/2023   Procedure: LEFT HEART CATH AND CORONARY ANGIOGRAPHY;  Surgeon: Burnard Debby LABOR, MD;  Location: [facility] INVASIVE CV LAB;  Service: Cardiovascular;  Laterality: N/A;   LUMBAR LAMINECTOMY/DECOMPRESSION MICRODISCECTOMY Left 01/30/2013   Procedure: LUMBAR LAMINECTOMY/DECOMPRESSION MICRODISCECTOMY 1 LEVEL;  Surgeon: Darina MALVA Boehringer, MD;  Location: [facility] NEURO ORS;  Service: Neurosurgery;  Laterality: Left;  lumbar four-five     Current Meds  Medication Sig   aspirin  81 MG chewable tablet Chew 1 tablet (81 mg total) by mouth daily.   atorvastatin  (LIPITOR) 40 MG tablet Take 1 tablet (40 mg total) by mouth daily.   carvedilol (COREG) 3.125 MG tablet Take 1 tablet (3.125 mg total) by mouth 2 (two) times daily.   clopidogrel  (PLAVIX ) 75 MG tablet Take 1 tablet (75 mg total) by mouth daily.   diazepam  (VALIUM ) 2 MG tablet Take 2 mg by mouth daily as needed for anxiety.   losartan (COZAAR) 25 MG tablet Take 1 tablet (25 mg total) by mouth daily.   nitroGLYCERIN  (NITROSTAT ) 0.4 MG SL tablet Place 1 tablet (0.4 mg total) under the tongue every 5 (five) minutes as needed.   tadalafil  (CIALIS ) 5 MG tablet Take 1 tablet (5 mg total) by mouth daily.   [DISCONTINUED] metoprolol  tartrate (LOPRESSOR ) 25 MG tablet Take 1 tablet (  25 mg total) by mouth 2 (two) times daily.     Allergies:   Shellfish allergy   Social History   Tobacco Use   Smoking status: Former    Current packs/day: 0.00    Average packs/day: 0.5 packs/day for 18.0 years (9.0 ttl pk-yrs)    Types: Cigarettes    Quit date: 03/2023    Years since quitting: 1.3  Vaping Use   Vaping status: Never Used  Substance Use Topics   Alcohol  use: Yes   Drug use: Yes    Types: Marijuana     Family  Hx: The patient's family history includes Diabetes in his mother; Hypertension in his mother.  ROS:   Please see the history of present illness.    All other systems reviewed and are negative.  Labs/Other Tests and Data Reviewed:     Recent Labs: No results found for requested labs within last 365 days.   Recent Lipid Panel Lab Results  Component Value Date/Time   CHOL 65 (L) 07/13/2023 10:11 AM   TRIG 49 07/13/2023 10:11 AM   HDL 40 07/13/2023 10:11 AM   CHOLHDL 1.6 07/13/2023 10:11 AM   CHOLHDL 5.1 04/04/2023 06:16 AM   LDLCALC 12 07/13/2023 10:11 AM    Wt Readings from Last 3 Encounters:  07/03/24 170 lb (77.1 kg)  12/17/23 174 lb 12.8 oz (79.3 kg)  11/12/23 173 lb 3.2 oz (78.6 kg)     Risk Assessment/Calculations:          Objective:    Vital Signs:  BP (!) 164/115 (BP Location: Left Arm) Comment: home monitor  Pulse 65   Ht 5' 8 (1.727 m)   Wt 170 lb (77.1 kg) Comment: pt stated  BMI 25.85 kg/m    VITAL SIGNS:  reviewed   ASSESSMENT & PLAN:     HTN, poorly controlled: Home blood pressures range around 160 mmHg SBP.  Goal BP less than 130 mmHg SBP.  Stop metoprolol  and start carvedilol 3.125 mg twice daily, start losartan 25 mg once daily.  BMP in 5 days.  CAD manifested by inferior STEMI in July 2024 s/p RCA PCI with residual OM1 60% stenosis and normal LVEF: No interval angina or DOE.  Currently on DAPT.  Brilinta  switched to Plavix  in the past due to cost issues.  Continue atorvastatin  40 mg nightly.  Quit smoking.   HLD, at goal: LDL 12 in November 2024.  Continue atorvastatin  40 mg nightly.  Patient discontinued Repatha .   Mild to moderate MR: Echo in 2025 showed moderate MR.  Asymptomatic.  I reviewed and discussed echocardiogram findings with the patient today.  Repeat echocardiogram in 1 year.   PAD: ABI 0.7 in LLE.  Follows up with vascular.  Continue cardioprotective medications as stated above.  Quit smoking at the time of MI.     Time:    Today, I have spent 30 minutes with the patient with telehealth technology discussing the above problems, reviewing records, >3 labs.     Medication Adjustments/Labs and Tests Ordered: Current medicines are reviewed at length with the patient today.  Concerns regarding medicines are outlined above.   Tests Ordered: Orders Placed This Encounter  Procedures   Basic metabolic panel with GFR    Medication Changes: Meds ordered this encounter  Medications   carvedilol (COREG) 3.125 MG tablet    Sig: Take 1 tablet (3.125 mg total) by mouth 2 (two) times daily.    Dispense:  180 tablet    Refill:  1    07/03/2024 NEW-stop metoprolol    losartan (COZAAR) 25 MG tablet    Sig: Take 1 tablet (25 mg total) by mouth daily.    Dispense:  90 tablet    Refill:  1    07/03/2024 NEW    Follow Up:  In Person 2 months, keep appt in Dec 2025  Signed, Diannah SHAUNNA Maywood, MD  07/08/2024 4:50 PM    [facility]

## 2024-07-07 ENCOUNTER — Telehealth: Payer: Self-pay | Admitting: Internal Medicine

## 2024-07-07 NOTE — Telephone Encounter (Signed)
 Reports concerns of elevated home BP's. Denies dizziness, headache, or blurred vision. Confirmed taking losartan 25 mg daily and carvedilol 3.125 mg BID since Friday morning 07/04/2024. Reports using Vicks nasal spray daily for 5-6 months but has stopped taking this since Friday. Reports checking blood pressures immediately after sitting, and smoking.  Advised that recent BP medication adjustments made and needed more time to take affect.  Advised to regularly monitor and record your blood pressure readings at home, 5-10 minutes after sitting, 1-2 hours after medications,using the same machine, same arm, and at the same time of day. Advised to record readings and send blood pressure readings to our office through mychart on Friday 07/11/2024 for review. Offered nurse visit for this Friday, 07/11/2024 but declined due to only being able to travel locally. Home blood pressure monitoring information provided and made available through mychart.

## 2024-07-07 NOTE — Telephone Encounter (Signed)
 Pt c/o BP issue: STAT if pt c/o blurred vision, one-sided weakness or slurred speech.  STAT if BP is GREATER than 180/120 TODAY.  STAT if BP is LESS than 90/60 and SYMPTOMATIC TODAY  1. What is your BP concern? Pt gf called in concerned that pt bp is too high.   2. Have you taken any BP medication today? Yes; the reading was taken before meds. He took bp medication 5 minutes prior to calling in.   3. What are your last 5 BP readings?  174/115 - today (5-6 am)  170/? (Did not remember bottom number) - last night   4. Are you having any other symptoms (ex. Dizziness, headache, blurred vision, passed out)? No   Pt gf states she did some research that the stuffy nose medication that goes up the nose can cause bp to be high but he has been using it for 5 months now.

## 2024-07-17 NOTE — Progress Notes (Signed)
 Date of COVID positive in last 90 days:  PCP - [facility]- Dr. Job- no longer there Cardiologist - Diannah Maywood, MD  Cardiac clearance by Rosabel Mose, NP 06/30/24 in Epic   Chest x-ray - N/A EKG - 12/17/23 Epic Stress Test - N/A ECHO - 06/17/24 Epic Cardiac Cath - 04/04/23 Epic Pacemaker/ICD device last checked:N/A Spinal Cord Stimulator:N/A  Bowel Prep - N/A  Sleep Study - N/A CPAP -   Fasting Blood Sugar - N/A Checks Blood Sugar _____ times a day  Last dose of GLP1 agonist-  N/A GLP1 instructions:  Do not take after     Last dose of SGLT-2 inhibitors-  N/A SGLT-2 instructions:  Do not take after     Blood Thinner Instructions: Plavix , hold 4 days Aspirin  Instructions: ASA 81, hold 4 days Last Dose: 07/17/24 0900  Activity level: Can go up a flight of stairs and perform activities of daily living without stopping and without symptoms of chest pain or shortness of breath.  Anesthesia review: HTN, PVD, CAD, STEMI, CVA, anemia, BP 182/116, 186/114, and 168/112. Patient denies symptoms but states he does have uncontrolled BP and recent med changes. Instructed to call his cardiologist to let them know.  Patient denies shortness of breath, fever, cough and chest pain at PAT appointment  Patient verbalized understanding of instructions that were given to them at the PAT appointment. Patient was also instructed that they will need to review over the PAT instructions again at home before surgery.

## 2024-07-18 ENCOUNTER — Other Ambulatory Visit: Payer: Self-pay

## 2024-07-18 ENCOUNTER — Encounter (HOSPITAL_COMMUNITY)
Admission: RE | Admit: 2024-07-18 | Discharge: 2024-07-18 | Disposition: A | Discharge status: Home / Self Care | Source: Ambulatory Visit | Attending: Urology | Admitting: Urology

## 2024-07-18 ENCOUNTER — Telehealth: Payer: Self-pay | Admitting: Internal Medicine

## 2024-07-18 ENCOUNTER — Encounter (HOSPITAL_COMMUNITY): Payer: Self-pay

## 2024-07-18 VITALS — BP 179/116 | HR 69 | Temp 98.3°F | Resp 16 | Ht 68.0 in | Wt 172.0 lb

## 2024-07-18 DIAGNOSIS — I10 Essential (primary) hypertension: Secondary | ICD-10-CM | POA: Insufficient documentation

## 2024-07-18 DIAGNOSIS — N471 Phimosis: Secondary | ICD-10-CM | POA: Insufficient documentation

## 2024-07-18 DIAGNOSIS — I739 Peripheral vascular disease, unspecified: Secondary | ICD-10-CM | POA: Diagnosis not present

## 2024-07-18 DIAGNOSIS — I25119 Atherosclerotic heart disease of native coronary artery with unspecified angina pectoris: Secondary | ICD-10-CM | POA: Insufficient documentation

## 2024-07-18 DIAGNOSIS — I252 Old myocardial infarction: Secondary | ICD-10-CM | POA: Insufficient documentation

## 2024-07-18 DIAGNOSIS — Z8673 Personal history of transient ischemic attack (TIA), and cerebral infarction without residual deficits: Secondary | ICD-10-CM | POA: Insufficient documentation

## 2024-07-18 DIAGNOSIS — I2510 Atherosclerotic heart disease of native coronary artery without angina pectoris: Secondary | ICD-10-CM | POA: Insufficient documentation

## 2024-07-18 DIAGNOSIS — Z01818 Encounter for other preprocedural examination: Secondary | ICD-10-CM | POA: Diagnosis not present

## 2024-07-18 DIAGNOSIS — N481 Balanitis: Secondary | ICD-10-CM | POA: Diagnosis not present

## 2024-07-18 HISTORY — DX: Atherosclerotic heart disease of native coronary artery without angina pectoris: I25.10

## 2024-07-18 HISTORY — DX: Anemia, unspecified: D64.9

## 2024-07-18 HISTORY — DX: Bronchitis, not specified as acute or chronic: J40

## 2024-07-18 HISTORY — DX: Acute myocardial infarction, unspecified: I21.9

## 2024-07-18 HISTORY — DX: Peripheral vascular disease, unspecified: I73.9

## 2024-07-18 HISTORY — DX: Unspecified osteoarthritis, unspecified site: M19.90

## 2024-07-18 LAB — CBC
HCT: 40.9 % (ref 39.0–52.0)
Hemoglobin: 13.2 g/dL (ref 13.0–17.0)
MCH: 30.3 pg (ref 26.0–34.0)
MCHC: 32.3 g/dL (ref 30.0–36.0)
MCV: 94 fL (ref 80.0–100.0)
Platelets: 234 K/uL (ref 150–400)
RBC: 4.35 MIL/uL (ref 4.22–5.81)
RDW: 14.3 % (ref 11.5–15.5)
WBC: 7.6 K/uL (ref 4.0–10.5)
nRBC: 0 % (ref 0.0–0.2)

## 2024-07-18 LAB — BASIC METABOLIC PANEL WITH GFR
Anion gap: 10 (ref 5–15)
BUN: 7 mg/dL (ref 6–20)
CO2: 26 mmol/L (ref 22–32)
Calcium: 9.4 mg/dL (ref 8.9–10.3)
Chloride: 104 mmol/L (ref 98–111)
Creatinine, Ser: 1.23 mg/dL (ref 0.61–1.24)
GFR, Estimated: >60 mL/min (ref >60)
Glucose, Bld: 90 mg/dL (ref 70–99)
Potassium: 4.1 mmol/L (ref 3.5–5.1)
Sodium: 140 mmol/L (ref 135–145)

## 2024-07-18 NOTE — Telephone Encounter (Signed)
 07/03/24 provider note:  HTN, poorly controlled: Home blood pressures range around 160 mmHg SBP.  Goal BP less than 130 mmHg SBP.  Stop metoprolol  and start carvedilol 3.125 mg twice daily, start losartan 25 mg once daily.  BMP in 5 days.        Dale Fowler reports patient compliant with all medications. Please advise.

## 2024-07-18 NOTE — H&P (Signed)
 06/05/24: Dale Fowler is a 58 yo male who presents with a 1 month history of a tight foreskin. I saw him in [place] last year for voiding symptoms and an elevated PSA. He had cystoscopy. His IPSS is 7 but he has some issues with his stream because of the foreskin.      ALLERGIES: None   MEDICATIONS: Metoprolol  Tartrate 25 MG Tablet  Plavix  75 MG Tablet  Aspirin  81  Atorvastatin  Calcium  40 MG Tablet  diazePAM  2 MG Tablet  Nitroglycerin   Tadalafil  5 MG Tablet     GU PSH: No GU PSH      PSH Notes: Lumbar x2  Stints   NON-GU PSH: No Non-GU PSH    GU PMH: None     PMH Notes: Heart attack   NON-GU PMH: Arthritis Depression GERD Heart disease, unspecified Hypercholesterolemia Hypertension Stroke/TIA    FAMILY HISTORY: Death In The Family Father - Other Death In The Family Mother - Other Diabetes - Runs in Family Heart Attack - Runs in Family Hypertension - Runs in Family Kidney Failure - Runs in Family Kidney Stones - Runs in Family stroke - Runs in Family   SOCIAL HISTORY: Marital Status: Divorced Preferred Language: English; Ethnicity: Not Hispanic Or Latino; Race: Black or African American Current Smoking Status: Patient does not smoke anymore. Has not smoked since 05/13/2023. Smoked for 28 years.   Tobacco Use Assessment Completed: Used Tobacco in last 30 days? Does not use smokeless tobacco. Does not use drugs. Drinks 3 caffeinated drinks per day.    REVIEW OF SYSTEMS:    GU Review Male:   Patient reports frequent urination, hard to postpone urination, get up at night to urinate, erection problems, and penile pain. Patient denies burning/ pain with urination, leakage of urine, stream starts and stops, trouble starting your stream, and have to strain to urinate .  Gastrointestinal (Upper):   Patient denies nausea, vomiting, and indigestion/ heartburn.  Gastrointestinal (Lower):   Patient denies diarrhea and constipation.  Constitutional:   Patient reports fatigue.  Patient denies fever, night sweats, and weight loss.  Skin:   Patient reports skin rash/ lesion and itching.   Eyes:   Patient denies double vision and blurred vision.  Ears/ Nose/ Throat:   Patient reports sinus problems. Patient denies sore throat.  Hematologic/Lymphatic:   Patient reports easy bruising. Patient denies swollen glands.  Cardiovascular:   Patient denies leg swelling and chest pains.  Respiratory:   Patient reports cough and shortness of breath.   Endocrine:   Patient denies excessive thirst.  Musculoskeletal:   Patient reports back pain and joint pain.   Neurological:   Patient denies headaches and dizziness.  Psychologic:   Patient reports depression and anxiety.    VITAL SIGNS:      06/06/2024 02:51 PM  Weight 172 lb / 78.02 kg  Height 68 in / 172.72 cm  BP 170/112 mmHg  Heart Rate 65 /min  Temperature 98.6 F / 37 C  BMI 26.1 kg/m   GU PHYSICAL EXAMINATION:    Scrotum: No lesions. No edema. No cysts. No warts.  Epididymides: Right: no spermatocele, no masses, no cysts, no tenderness, no induration, no enlargement. Left: no spermatocele, no masses, no cysts, no tenderness, no induration, no enlargement.  Testes: No tenderness, no swelling, no enlargement left testes. No tenderness, no swelling, no enlargement right testes. Normal location left testes. Normal location right testes. No mass, no cyst, no varicocele, no hydrocele left testes. No mass, no cyst, no  varicocele, no hydrocele right testes.  Urethral Meatus: Normal size. No lesion, no wart, no discharge, no polyp. Normal location.  Penis: Penis uncircumcised, phimosis moderately severe and not retractable with some depigmentation of the phimotic ring. Mild balanitis.    MULTI-SYSTEM PHYSICAL EXAMINATION:    Constitutional: Well-nourished. No physical deformities. Normally developed. Good grooming.  Neck: Neck symmetrical, not swollen. Normal tracheal position.  Respiratory: No labored breathing, no use of  accessory muscles.   Cardiovascular: Normal temperature, normal extremity pulses, no swelling, no varicosities.  Lymphatic: No enlargement of neck, axillae, groin.  Skin: No paleness, no jaundice, no cyanosis. No lesion, no ulcer, no rash.  Neurologic / Psychiatric: Oriented to time, oriented to place, oriented to person. No depression, no anxiety, no agitation.  Gastrointestinal: No mass, no tenderness, no rigidity, non obese abdomen.  Eyes: Normal conjunctivae. Normal eyelids.  Ears, Nose, Mouth, and Throat: Left ear no scars, no lesions, no masses. Right ear no scars, no lesions, no masses. Nose no scars, no lesions, no masses. Normal hearing. Normal lips.  Musculoskeletal: Normal gait and station of head and neck.     Complexity of Data:  Records Review:   AUA Symptom Score, Previous Doctor Records  Urine Test Review:   Urinalysis   PROCEDURES:          Urinalysis w/Scope Dipstick Dipstick Cont'd Micro  Color: Yellow Bilirubin: Neg mg/dL WBC/hpf: 0 - 5/hpf  Appearance: Clear Ketones: Neg mg/dL RBC/hpf: 0 - 2/hpf  Specific Gravity: 1.015 Blood: 2+ ery/uL Bacteria: Few (10-25/hpf)  pH: 7.0 Protein: Trace mg/dL Cystals: NS (Not Seen)  Glucose: Neg mg/dL Urobilinogen: 0.2 mg/dL Casts: NS (Not Seen)    Nitrites: Neg Trichomonas: Not Present    Leukocyte Esterase: Trace leu/uL Mucous: Not Present      Epithelial Cells: 0 - 5/hpf      Yeast: NS (Not Seen)      Sperm: Not Present    ASSESSMENT:      ICD-10 Details  1 GU:   Phimosis - N47.1 Undiagnosed New Problem - He has moderate to severe phimosis and possible balanitis. I will give him clotrimazole/betamethasone cream and if that doesn't help, I will do a circumcision which I will go ahead and schedule.   I reviewed the risks of bleeding, infection, penile injury, scar, insufficient or redundant residual skin, pain, thrombotic events and anesthetic complications.    2   Balanitis - N48.1 Undiagnosed New Problem   PLAN:             Medications New Meds: Clotrimazole-Betamethasone 1-0.05 % Cream Apply sparingly to the affected area 2-3x daily.   #30  1 Refill(s)  Pharmacy Name:  [facility]  Address:  [address]   [place], [place] [id number]  Phone:  [phone]  Fax:  [phone]            Schedule Return Visit/Planned Activity: Next Available Appointment - Schedule Surgery             Note: Needs a circumcision in a month or so.   Procedure: Unspecified Date - Non-Newborn Circumcision - [phone] Notes: Next available.

## 2024-07-18 NOTE — Patient Instructions (Signed)
 SURGICAL WAITING ROOM VISITATION  Patients having surgery or a procedure may have no more than 2 support people in the waiting area - these visitors may rotate.    Children under the age of 52 must have an adult with them who is not the patient.  Visitors with respiratory illnesses are discouraged from visiting and should remain at home.  If the patient needs to stay at the hospital during part of their recovery, the visitor guidelines for inpatient rooms apply. Pre-op nurse will coordinate an appropriate time for 1 support person to accompany patient in pre-op.  This support person may not rotate.    Please refer to the [facility] website for the visitor guidelines for Inpatients (after your surgery is over and you are in a regular room).    Your procedure is scheduled on: 07/22/24   Report to [facility] Main Entrance    Report to admitting at 7:40 AM   Call this number if you have problems the morning of surgery [phone]   Do not eat food  or drink liquids :After Midnight.          If you have questions, please contact your surgeon's office.   FOLLOW BOWEL PREP AND ANY ADDITIONAL PRE OP INSTRUCTIONS YOU RECEIVED FROM YOUR SURGEON'S OFFICE!!!     Oral Hygiene is also important to reduce your risk of infection.                                    Remember - BRUSH YOUR TEETH THE MORNING OF SURGERY WITH YOUR REGULAR TOOTHPASTE  DENTURES WILL BE REMOVED PRIOR TO SURGERY PLEASE DO NOT APPLY Poly grip OR ADHESIVES!!!   Stop all vitamins and herbal supplements 7 days before surgery.   Take these medicines the morning of surgery with A SIP OF WATER: Carvedilol, Diazepam                                You may not have any metal on your body including jewelry, and body piercing             Do not wear lotions, powders, cologne, or deodorant              Men may shave face and neck.   Do not bring valuables to the hospital. [facility] IS NOT             RESPONSIBLE    FOR VALUABLES.   Contacts, glasses, dentures or bridgework may not be worn into surgery.  DO NOT BRING YOUR HOME MEDICATIONS TO THE HOSPITAL. PHARMACY WILL DISPENSE MEDICATIONS LISTED ON YOUR MEDICATION LIST TO YOU DURING YOUR ADMISSION IN THE HOSPITAL!    Patients discharged on the day of surgery will not be allowed to drive home.  Someone NEEDS to stay with you for the first 24 hours after anesthesia.              Please read over the following fact sheets you were given: IF YOU HAVE QUESTIONS ABOUT YOUR PRE-OP INSTRUCTIONS PLEASE CALL [phone].   If you received a COVID test during your pre-op visit  it is requested that you wear a mask when out in public, stay away from anyone that may not be feeling well and notify your surgeon if you develop symptoms. If you test positive for Covid or have  been in contact with anyone that has tested positive in the last 10 days please notify you surgeon.    [place] - Preparing for Surgery Before surgery, you can play an important role.  Because skin is not sterile, your skin needs to be as free of germs as possible.  You can reduce the number of germs on your skin by washing with CHG (chlorahexidine gluconate) soap before surgery.  CHG is an antiseptic cleaner which kills germs and bonds with the skin to continue killing germs even after washing. Please DO NOT use if you have an allergy to CHG or antibacterial soaps.  If your skin becomes reddened/irritated stop using the CHG and inform your nurse when you arrive at Short Stay. Do not shave (including legs and underarms) for at least 48 hours prior to the first CHG shower.  You may shave your face/neck.  Please follow these instructions carefully:  1.  Shower with CHG Soap the night before surgery ONLY (DO NOT USE THE SOAP THE MORNING OF SURGERY).  2.  If you choose to wash your hair, wash your hair first as usual with your normal  shampoo.  3.  After you shampoo, rinse your hair and body  thoroughly to remove the shampoo.                             4.  Use CHG as you would any other liquid soap.  You can apply chg directly to the skin and wash.  Gently with a scrungie or clean washcloth.  5.  Apply the CHG Soap to your body ONLY FROM THE NECK DOWN.   Do   not use on face/ open                           Wound or open sores. Avoid contact with eyes, ears mouth and   genitals (private parts).                       Wash face,  Genitals (private parts) with your normal soap.             6.  Wash thoroughly, paying special attention to the area where your    surgery  will be performed.  7.  Thoroughly rinse your body with warm water from the neck down.  8.  DO NOT shower/wash with your normal soap after using and rinsing off the CHG Soap.                9.  Pat yourself dry with a clean towel.            10.  Wear clean pajamas.            11.  Place clean sheets on your bed the night of your first shower and do not  sleep with pets. Day of Surgery : Do not apply any CHG, lotions/deodorants the morning of surgery.  Please wear clean clothes to the hospital/surgery center.  FAILURE TO FOLLOW THESE INSTRUCTIONS MAY RESULT IN THE CANCELLATION OF YOUR SURGERY  PATIENT SIGNATURE_________________________________  NURSE SIGNATURE__________________________________  ________________________________________________________________________

## 2024-07-18 NOTE — Telephone Encounter (Signed)
 Pt c/o BP issue: STAT if pt c/o blurred vision, one-sided weakness or slurred speech.  STAT if BP is GREATER than 180/120 TODAY.  STAT if BP is LESS than 90/60 and SYMPTOMATIC TODAY  1. What is your BP concern? Still elevated  2. Have you taken any BP medication today?yes  3. What are your last 5 BP readings?179/116, 168/112  4. Are you having any other symptoms (ex. Dizziness, headache, blurred vision, passed out)? no

## 2024-07-21 ENCOUNTER — Telehealth: Payer: Self-pay

## 2024-07-21 MED ORDER — AMLODIPINE BESYLATE 5 MG PO TABS: 5.0000 mg | ORAL_TABLET | Freq: Two times a day (BID) | ORAL | 3 refills | Status: AC

## 2024-07-21 NOTE — Progress Notes (Signed)
 Anesthesia Chart Review   Case: [id number] Date/Time: 07/22/24 0940   Procedure: CIRCUMCISION, ADULT   Anesthesia type: General   Diagnosis:      Phimosis [N47.1]     Balanitis [N48.1]   Pre-op diagnosis: PHIMOSIS AND BALANITIS   Location: [facility] / [facility]   Surgeons: Watt Rush, MD       DISCUSSION:57 y.o. former smoker with h/o HTN, TIA, CAD inferior STEMI S/P RCA PCI in July 2024 with residual OM1 60% stenosis and normal LVEF, PVD, phimosis scheduled for above procedure 07/22/2024 with Dr. Rush Watt.   Per preoperative evaluation 06/30/2024, Dale Fowler perioperative risk of a major cardiac event is 6.6% according to the Revised Cardiac Risk Index (RCRI).  Therefore, he is at high risk for perioperative complications.   His functional capacity is good at 6.27 METs according to the Duke Activity Status Index (DASI). Recommendations: According to ACC/AHA guidelines, no further cardiovascular testing needed.  The patient may proceed to surgery at acceptable risk.   Antiplatelet and/or Anticoagulation Recommendations: Per Dr. Mallipeddi patient may hold aspirin  and Plavix  for 7 days prior to procedure, please resume Plavix  as soon as safe to do so from a bleeding standpoint.  As patient has completed 1 year of DAPT he does not need to resume aspirin .  Pt last seen by cardiology 07/03/2024. HTN poorly controlled at this visit.  Pt was advised to stop metoprolol  and start carvedilol 3.125mg  twice daily, start losartan 25 mg daily. Elevated BP at PAT visit, advised to contact cardiology. Amlodipine  added.  Evaluate DOS.   BP Readings from Last 3 Encounters:  07/18/24 (!) 179/116  07/03/24 (!) 164/115  02/12/24 (!) 168/95     VS: BP (!) 179/116   Pulse 69   Temp 36.8 C (Oral)   Resp 16   Ht 5' 8 (1.727 m)   Wt 78 kg   SpO2 100%   BMI 26.15 kg/m   PROVIDERS: [facility]  Cardiologist:  Diannah SHAUNNA Maywood, MD  LABS: Labs reviewed: Acceptable for  surgery. (all labs ordered are listed, but only abnormal results are displayed)  Labs Reviewed  BASIC METABOLIC PANEL WITH GFR  CBC     IMAGES:   EKG:   CV: Echo 06/17/2024 1. Left ventricular ejection fraction, by estimation, is approximately  55%. The left ventricle has normal function. The left ventricle  demonstrates regional wall motion abnormalities (see scoring  diagram/findings for description). There is mild  concentric left ventricular hypertrophy. Left ventricular diastolic  parameters are indeterminate. Elevated left atrial pressure.   2. Right ventricular systolic function is normal. The right ventricular  size is normal. There is normal pulmonary artery systolic pressure. The  estimated right ventricular systolic pressure is 35.5 mmHg.   3. The mitral valve is abnormal, posterior leaflet is mildly restricted.  Moderate mitral valve regurgitation, posteriorly directed.   4. The aortic valve is tricuspid. Aortic valve regurgitation is not  visualized. No aortic stenosis is present.   5. The inferior vena cava is normal in size with greater than 50%  respiratory variability, suggesting right atrial pressure of 3 mmHg.   Past Medical History:  Diagnosis Date   Anemia    Arthritis    Bronchitis    Carotid artery occlusion    Coronary artery disease    GERD (gastroesophageal reflux disease)    Hyperlipidemia    Hypertension    does not see a cardiologist   Myocardial infarction [facility])    PVD (  peripheral vascular disease)    TIA (transient ischemic attack)    X2    Past Surgical History:  Procedure Laterality Date   BACK SURGERY     CORONARY/GRAFT ACUTE MI REVASCULARIZATION N/A 04/04/2023   Procedure: Coronary/Graft Acute MI Revascularization;  Surgeon: Burnard Debby LABOR, MD;  Location: [facility] INVASIVE CV LAB;  Service: Cardiovascular;  Laterality: N/A;   LEFT HEART CATH AND CORONARY ANGIOGRAPHY N/A 04/04/2023   Procedure: LEFT HEART CATH AND CORONARY  ANGIOGRAPHY;  Surgeon: Burnard Debby LABOR, MD;  Location: [facility] INVASIVE CV LAB;  Service: Cardiovascular;  Laterality: N/A;   LUMBAR LAMINECTOMY/DECOMPRESSION MICRODISCECTOMY Left 01/30/2013   Procedure: LUMBAR LAMINECTOMY/DECOMPRESSION MICRODISCECTOMY 1 LEVEL;  Surgeon: Darina MALVA Boehringer, MD;  Location: [facility] NEURO ORS;  Service: Neurosurgery;  Laterality: Left;  lumbar four-five    MEDICATIONS:  aspirin  81 MG chewable tablet   atorvastatin  (LIPITOR) 40 MG tablet   carvedilol (COREG) 3.125 MG tablet   clopidogrel  (PLAVIX ) 75 MG tablet   diazepam  (VALIUM ) 2 MG tablet   losartan (COZAAR) 25 MG tablet   nitroGLYCERIN  (NITROSTAT ) 0.4 MG SL tablet   tadalafil  (CIALIS ) 5 MG tablet   No current facility-administered medications for this encounter.     Dale Hoots Ward, PA-C [facility]Surgical [facility] [phone]

## 2024-07-21 NOTE — Telephone Encounter (Signed)
 Per Dr. Alissa response:   Start Amlodipine  5 mg BID.   Not a CI for surgery.    Spoke with patient and advised him will send into [facility].

## 2024-07-21 NOTE — Telephone Encounter (Signed)
 Patient notified of result.  Please refer to phone note from today for complete details.   Littie CHRISTELLA Croak, CMA 07/21/2024 4:25 PM

## 2024-07-21 NOTE — Anesthesia Preprocedure Evaluation (Signed)
 Anesthesia Evaluation  Patient identified by MRN, date of birth, ID band Patient awake    Reviewed: Allergy & Precautions, NPO status , Patient's Chart, lab work & pertinent test results  History of Anesthesia Complications Negative for: history of anesthetic complications  Airway Mallampati: II  TM Distance: >3 FB Neck ROM: Full    Dental  (+) Teeth Intact, Dental Advisory Given, Missing,    Pulmonary former smoker   breath sounds clear to auscultation       Cardiovascular hypertension (Poorly Controlled - SBP in 170-180s), Pt. on medications + CAD, + Past MI, + Cardiac Stents (on Plavix ) and + Peripheral Vascular Disease   Rhythm:Regular Rate:Normal  TTE (06/2024): Normal biventricular function. Moderate MR secondary to restricted posterior leaflet. No pHTN.   Neuro/Psych TIA (2019)   GI/Hepatic ,GERD  Medicated and Controlled,,  Endo/Other  neg diabetes    Renal/GU Renal disease     Musculoskeletal  (+) Arthritis , Osteoarthritis,    Abdominal   Peds  Hematology  (+) Blood dyscrasia, anemia Hgb 13.2, Plts 234K (07/18/24)   Anesthesia Other Findings   Reproductive/Obstetrics                              Anesthesia Physical Anesthesia Plan  ASA: 3  Anesthesia Plan: General   Post-op Pain Management:    Induction: Intravenous  PONV Risk Score and Plan: 2 and Ondansetron , Treatment may vary due to age or medical condition and Dexamethasone   Airway Management Planned: Oral ETT  Additional Equipment:   Intra-op Plan:   Post-operative Plan: Extubation in OR  Informed Consent:      Dental advisory given  Plan Discussed with:   Anesthesia Plan Comments: (See PAT note 07/18/2024)         Anesthesia Quick Evaluation

## 2024-07-22 ENCOUNTER — Ambulatory Visit (HOSPITAL_BASED_OUTPATIENT_CLINIC_OR_DEPARTMENT_OTHER)

## 2024-07-22 ENCOUNTER — Encounter (HOSPITAL_COMMUNITY): Payer: Self-pay | Admitting: Urology

## 2024-07-22 ENCOUNTER — Ambulatory Visit (HOSPITAL_COMMUNITY)
Admission: RE | Admit: 2024-07-22 | Discharge: 2024-07-22 | Disposition: A | Discharge status: Home / Self Care | Attending: Urology | Admitting: Urology

## 2024-07-22 ENCOUNTER — Ambulatory Visit (HOSPITAL_COMMUNITY): Payer: Self-pay | Admitting: Physician Assistant

## 2024-07-22 ENCOUNTER — Encounter (HOSPITAL_COMMUNITY)
Admission: RE | Disposition: A | Discharge status: Home / Self Care | Payer: Self-pay | Source: Home / Self Care | Attending: Urology

## 2024-07-22 DIAGNOSIS — I2510 Atherosclerotic heart disease of native coronary artery without angina pectoris: Secondary | ICD-10-CM | POA: Diagnosis not present

## 2024-07-22 DIAGNOSIS — Z87891 Personal history of nicotine dependence: Secondary | ICD-10-CM

## 2024-07-22 DIAGNOSIS — N481 Balanitis: Secondary | ICD-10-CM | POA: Diagnosis present

## 2024-07-22 DIAGNOSIS — N471 Phimosis: Secondary | ICD-10-CM | POA: Insufficient documentation

## 2024-07-22 DIAGNOSIS — I252 Old myocardial infarction: Secondary | ICD-10-CM | POA: Diagnosis not present

## 2024-07-22 DIAGNOSIS — I10 Essential (primary) hypertension: Secondary | ICD-10-CM | POA: Insufficient documentation

## 2024-07-22 DIAGNOSIS — K219 Gastro-esophageal reflux disease without esophagitis: Secondary | ICD-10-CM | POA: Insufficient documentation

## 2024-07-22 DIAGNOSIS — Z955 Presence of coronary angioplasty implant and graft: Secondary | ICD-10-CM | POA: Diagnosis not present

## 2024-07-22 DIAGNOSIS — Z79899 Other long term (current) drug therapy: Secondary | ICD-10-CM | POA: Insufficient documentation

## 2024-07-22 DIAGNOSIS — Z7902 Long term (current) use of antithrombotics/antiplatelets: Secondary | ICD-10-CM | POA: Diagnosis not present

## 2024-07-22 DIAGNOSIS — D649 Anemia, unspecified: Secondary | ICD-10-CM | POA: Insufficient documentation

## 2024-07-22 DIAGNOSIS — I739 Peripheral vascular disease, unspecified: Secondary | ICD-10-CM | POA: Insufficient documentation

## 2024-07-22 DIAGNOSIS — Z8673 Personal history of transient ischemic attack (TIA), and cerebral infarction without residual deficits: Secondary | ICD-10-CM | POA: Diagnosis not present

## 2024-07-22 HISTORY — PX: CIRCUMCISION: SHX1350

## 2024-07-22 MED ORDER — SODIUM CHLORIDE 0.9% FLUSH: 3.0000 mL | INTRAVENOUS | Status: DC | PRN

## 2024-07-22 MED ORDER — SUGAMMADEX SODIUM 200 MG/2ML IV SOLN
INTRAVENOUS | Status: AC
Start: 2024-07-22 — End: 2024-07-22
  Filled 2024-07-22: qty 4

## 2024-07-22 MED ORDER — ACETAMINOPHEN 325 MG PO TABS: 650.0000 mg | ORAL_TABLET | ORAL | Status: DC | PRN

## 2024-07-22 MED ORDER — CHLORHEXIDINE GLUCONATE 0.12 % MT SOLN
15.0000 mL | Freq: Once | OROMUCOSAL | Status: AC
  Administered 2024-07-22: 15 mL via OROMUCOSAL

## 2024-07-22 MED ORDER — MIDAZOLAM HCL 2 MG/2ML IJ SOLN
INTRAMUSCULAR | Status: AC
  Filled 2024-07-22: qty 2

## 2024-07-22 MED ORDER — LACTATED RINGERS IV SOLN: INTRAVENOUS | Status: DC

## 2024-07-22 MED ORDER — ONDANSETRON HCL 4 MG/2ML IJ SOLN
INTRAMUSCULAR | Status: DC | PRN
  Administered 2024-07-22: 4 mg via INTRAVENOUS

## 2024-07-22 MED ORDER — FENTANYL CITRATE (PF) 100 MCG/2ML IJ SOLN
INTRAMUSCULAR | Status: DC | PRN
  Administered 2024-07-22: 100 ug via INTRAVENOUS

## 2024-07-22 MED ORDER — SODIUM CHLORIDE 0.9% FLUSH: 3.0000 mL | Freq: Two times a day (BID) | INTRAVENOUS | Status: DC

## 2024-07-22 MED ORDER — LABETALOL HCL 5 MG/ML IV SOLN: 5.0000 mg | INTRAVENOUS | Status: DC | PRN

## 2024-07-22 MED ORDER — ACETAMINOPHEN 650 MG RE SUPP: 650.0000 mg | RECTAL | Status: DC | PRN

## 2024-07-22 MED ORDER — CEFAZOLIN SODIUM-DEXTROSE 2-4 GM/100ML-% IV SOLN
2.0000 g | INTRAVENOUS | Status: AC
  Administered 2024-07-22: 2 g via INTRAVENOUS
  Filled 2024-07-22: qty 100

## 2024-07-22 MED ORDER — SUGAMMADEX SODIUM 200 MG/2ML IV SOLN
INTRAVENOUS | Status: DC | PRN
  Administered 2024-07-22: 200 mg via INTRAVENOUS

## 2024-07-22 MED ORDER — ORAL CARE MOUTH RINSE: 15.0000 mL | Freq: Once | OROMUCOSAL | Status: AC

## 2024-07-22 MED ORDER — LIDOCAINE HCL 1 % IJ SOLN
INTRAMUSCULAR | Status: DC | PRN
  Administered 2024-07-22: 3 mL

## 2024-07-22 MED ORDER — HYDROCODONE-ACETAMINOPHEN 5-325 MG PO TABS: 1.0000 | ORAL_TABLET | Freq: Four times a day (QID) | ORAL | 0 refills | Status: AC | PRN

## 2024-07-22 MED ORDER — MIDAZOLAM HCL 5 MG/5ML IJ SOLN
INTRAMUSCULAR | Status: DC | PRN
  Administered 2024-07-22: 2 mg via INTRAVENOUS

## 2024-07-22 MED ORDER — OXYCODONE HCL 5 MG/5ML PO SOLN: 5.0000 mg | Freq: Once | ORAL | Status: DC | PRN

## 2024-07-22 MED ORDER — FENTANYL CITRATE (PF) 50 MCG/ML IJ SOSY: 25.0000 ug | PREFILLED_SYRINGE | INTRAMUSCULAR | Status: DC | PRN

## 2024-07-22 MED ORDER — PROPOFOL 10 MG/ML IV BOLUS
INTRAVENOUS | Status: AC
  Filled 2024-07-22: qty 20

## 2024-07-22 MED ORDER — ONDANSETRON HCL 4 MG/2ML IJ SOLN
INTRAMUSCULAR | Status: AC
  Filled 2024-07-22: qty 2

## 2024-07-22 MED ORDER — BACITRACIN-NEOMYCIN-POLYMYXIN OINTMENT TUBE
TOPICAL_OINTMENT | CUTANEOUS | Status: AC
  Filled 2024-07-22: qty 14.17

## 2024-07-22 MED ORDER — ACETAMINOPHEN 10 MG/ML IV SOLN
1000.0000 mg | Freq: Once | INTRAVENOUS | Status: DC | PRN
Start: 2024-07-22 — End: 2024-07-22

## 2024-07-22 MED ORDER — BUPIVACAINE HCL (PF) 0.25 % IJ SOLN
INTRAMUSCULAR | Status: AC
Start: 2024-07-22 — End: 2024-07-22
  Filled 2024-07-22: qty 30

## 2024-07-22 MED ORDER — OXYCODONE HCL 5 MG PO TABS: 5.0000 mg | ORAL_TABLET | Freq: Once | ORAL | Status: DC | PRN

## 2024-07-22 MED ORDER — FENTANYL CITRATE (PF) 100 MCG/2ML IJ SOLN
INTRAMUSCULAR | Status: AC
  Filled 2024-07-22: qty 2

## 2024-07-22 MED ORDER — LIDOCAINE HCL (PF) 2 % IJ SOLN
INTRAMUSCULAR | Status: AC
  Filled 2024-07-22: qty 5

## 2024-07-22 MED ORDER — DEXAMETHASONE SODIUM PHOSPHATE 4 MG/ML IJ SOLN
INTRAMUSCULAR | Status: DC | PRN
  Administered 2024-07-22: 10 mg via INTRAVENOUS

## 2024-07-22 MED ORDER — MORPHINE SULFATE (PF) 2 MG/ML IV SOLN: 2.0000 mg | INTRAVENOUS | Status: DC | PRN

## 2024-07-22 MED ORDER — ROCURONIUM BROMIDE 100 MG/10ML IV SOLN
INTRAVENOUS | Status: DC | PRN
  Administered 2024-07-22: 60 mg via INTRAVENOUS

## 2024-07-22 MED ORDER — LIDOCAINE HCL (CARDIAC) PF 100 MG/5ML IV SOSY
PREFILLED_SYRINGE | INTRAVENOUS | Status: DC | PRN
  Administered 2024-07-22: 100 mg via INTRAVENOUS

## 2024-07-22 MED ORDER — ONDANSETRON HCL 4 MG/2ML IJ SOLN: 4.0000 mg | Freq: Once | INTRAMUSCULAR | Status: DC | PRN

## 2024-07-22 MED ORDER — LIDOCAINE HCL 1 % IJ SOLN
INTRAMUSCULAR | Status: AC
  Filled 2024-07-22: qty 20

## 2024-07-22 MED ORDER — PHENYLEPHRINE 80 MCG/ML (10ML) SYRINGE FOR IV PUSH (FOR BLOOD PRESSURE SUPPORT)
PREFILLED_SYRINGE | INTRAVENOUS | Status: AC
  Filled 2024-07-22: qty 10

## 2024-07-22 MED ORDER — BUPIVACAINE HCL 0.25 % IJ SOLN
INTRAMUSCULAR | Status: DC | PRN
  Administered 2024-07-22: 3 mL

## 2024-07-22 MED ORDER — AMISULPRIDE (ANTIEMETIC) 5 MG/2ML IV SOLN: 10.0000 mg | Freq: Once | INTRAVENOUS | Status: DC | PRN

## 2024-07-22 MED ORDER — OXYCODONE HCL 5 MG PO TABS: 5.0000 mg | ORAL_TABLET | ORAL | Status: DC | PRN

## 2024-07-22 MED ORDER — PROPOFOL 10 MG/ML IV BOLUS
INTRAVENOUS | Status: DC | PRN
  Administered 2024-07-22: 50 mg via INTRAVENOUS
  Administered 2024-07-22: 100 mg via INTRAVENOUS
  Administered 2024-07-22: 50 mg via INTRAVENOUS

## 2024-07-22 MED ORDER — ROCURONIUM BROMIDE 10 MG/ML (PF) SYRINGE
PREFILLED_SYRINGE | INTRAVENOUS | Status: AC
  Filled 2024-07-22: qty 10

## 2024-07-22 MED ORDER — 0.9 % SODIUM CHLORIDE (POUR BTL) OPTIME
TOPICAL | Status: DC | PRN
  Administered 2024-07-22: 1000 mL

## 2024-07-22 MED ORDER — SODIUM CHLORIDE 0.9 % IV SOLN
250.0000 mL | INTRAVENOUS | Status: DC | PRN
Start: 2024-07-22 — End: 2024-07-22

## 2024-07-22 SURGICAL SUPPLY — 2 items
ELECT NDL TIP 2.8 STRL (NEEDLE) ×1 IMPLANT
NDL HYPO 25X1 1.5 SAFETY (NEEDLE) IMPLANT

## 2024-07-22 NOTE — Transfer of Care (Signed)
 Immediate Anesthesia Transfer of Care Note  Patient: Dale Fowler  Procedure(s) Performed: CIRCUMCISION, ADULT  Patient Location: PACU  Anesthesia Type:General  Level of Consciousness: awake, alert , and oriented  Airway & Oxygen Therapy: Patient Spontanous Breathing and Patient connected to face mask oxygen  Post-op Assessment: Report given to RN and Post -op Vital signs reviewed and unstable, Anesthesiologist notified  Post vital signs: Reviewed and stable  Last Vitals:  Vitals Value Taken Time  BP 161/94 07/22/24 12:12  Temp    Pulse 87 07/22/24 12:15  Resp 20 07/22/24 12:15  SpO2 100 % 07/22/24 12:15  Vitals shown include unfiled device data.  Last Pain:  Vitals:   07/22/24 0909  TempSrc:   PainSc: 0-No pain         Complications: No notable events documented.

## 2024-07-22 NOTE — Op Note (Signed)
  Preoperative diagnosis: Phimosis  Postoperative diagnosis:  Phimosis  Procedure:  Circumcision  Surgeon: Norleen Seltzer. M.D.  Anesthesia: general, 6ml of bupivicaine  Complications: None  EBL: none  Specimens: none  Disposition of specimens: discarded  Indications:  This patient is to undergo circumcision for symptomatic phimosis.  Procedure:   The patient was placed in the supine position and anesthesia was induced.  The genitalia was prepped with betadine solution and he was draped in the usual sterile fashion.   A penile block was performed with 6cc of 0.25% marcaine .  The redundant foreskin was excised using the dorsal and ventral slit technique with excision of the redundant skin.  Hemostasis was achieved with the Bovie.  The skin edges were reapproximated with a running 4-0 chromic that was tied at the quadrants.  The wound was cleaned and dried.  A dressing of Xeroform, Kling and Coban was applied.  The anesthetic was reversed and he was taken to the recovery room in stable condition.  There were no complications.

## 2024-07-22 NOTE — Interval H&P Note (Signed)
 History and Physical Interval Note:  He is now on coreg and losartin for his HTN but he has no associated signs or symptoms or other changes in his history.      07/22/2024 10:01 AM  Dale Fowler  has presented today for surgery, with the diagnosis of PHIMOSIS AND BALANITIS.  The various methods of treatment have been discussed with the patient and family. After consideration of risks, benefits and other options for treatment, the patient has consented to  Procedure(s): CIRCUMCISION, ADULT (N/A) as a surgical intervention.  The patient's history has been reviewed, patient examined, no change in status, stable for surgery.  I have reviewed the patient's chart and labs.  Questions were answered to the patient's satisfaction.     Jekhi Bolin

## 2024-07-22 NOTE — Anesthesia Postprocedure Evaluation (Signed)
 Anesthesia Post Note  Patient: Dale Fowler  Procedure(s) Performed: CIRCUMCISION, ADULT     Patient location during evaluation: PACU Anesthesia Type: General Level of consciousness: awake Pain management: pain level controlled Vital Signs Assessment: post-procedure vital signs reviewed and stable Respiratory status: spontaneous breathing and nonlabored ventilation Cardiovascular status: blood pressure returned to baseline Postop Assessment: no apparent nausea or vomiting Anesthetic complications: no   No notable events documented.  Last Vitals:  Vitals:   07/22/24 1231 07/22/24 1242  BP:    Pulse:  74  Resp:  20  Temp: 36.4 C 36.5 C  SpO2:  99%    Last Pain:  Vitals:   07/22/24 1242  TempSrc: Oral  PainSc: 0-No pain                 Lauraine KATHEE [place]

## 2024-07-22 NOTE — Progress Notes (Signed)
 SPoke with DR Waddell regarding patients BP being up in post op, She will come see him

## 2024-07-22 NOTE — Anesthesia Procedure Notes (Signed)
 Procedure Name: Intubation Date/Time: 07/22/2024 11:28 AM  Performed by: Obadiah Reyes BROCKS, CRNAPre-anesthesia Checklist: Patient identified, Emergency Drugs available, Suction available, Patient being monitored and Timeout performed Patient Re-evaluated:Patient Re-evaluated prior to induction Oxygen Delivery Method: Circle system utilized Preoxygenation: Pre-oxygenation with 100% oxygen Induction Type: IV induction Ventilation: Mask ventilation without difficulty Laryngoscope Size: Miller and 2 Grade View: Grade I Tube type: Oral Tube size: 7.5 mm Number of attempts: 1 Airway Equipment and Method: Stylet Placement Confirmation: ETT inserted through vocal cords under direct vision, positive ETCO2 and breath sounds checked- equal and bilateral Secured at: 21 cm Tube secured with: Tape Dental Injury: Teeth and Oropharynx as per pre-operative assessment

## 2024-07-22 NOTE — Progress Notes (Signed)
 Patient with high BP < DR Waddell in to see patient and oked him to be discharged

## 2024-07-23 ENCOUNTER — Encounter (HOSPITAL_COMMUNITY): Payer: Self-pay | Admitting: Urology

## 2024-07-29 ENCOUNTER — Encounter: Payer: Self-pay | Admitting: Internal Medicine

## 2024-08-02 DIAGNOSIS — Z6826 Body mass index (BMI) 26.0-26.9, adult: Secondary | ICD-10-CM | POA: Diagnosis not present

## 2024-08-02 DIAGNOSIS — N4889 Other specified disorders of penis: Secondary | ICD-10-CM | POA: Diagnosis not present

## 2024-08-06 ENCOUNTER — Other Ambulatory Visit: Payer: Self-pay | Admitting: Urology

## 2024-08-06 DIAGNOSIS — N99840 Postprocedural hematoma of a genitourinary system organ or structure following a genitourinary system procedure: Secondary | ICD-10-CM | POA: Diagnosis not present

## 2024-08-11 ENCOUNTER — Other Ambulatory Visit: Payer: Self-pay | Admitting: Internal Medicine

## 2024-08-22 ENCOUNTER — Encounter: Payer: Self-pay | Admitting: Nurse Practitioner

## 2024-08-22 ENCOUNTER — Ambulatory Visit: Attending: Nurse Practitioner | Admitting: Nurse Practitioner

## 2024-08-22 VITALS — BP 158/94 | HR 93 | Ht 68.0 in | Wt 169.2 lb

## 2024-08-22 DIAGNOSIS — I10 Essential (primary) hypertension: Secondary | ICD-10-CM

## 2024-08-22 DIAGNOSIS — E785 Hyperlipidemia, unspecified: Secondary | ICD-10-CM | POA: Diagnosis not present

## 2024-08-22 DIAGNOSIS — I2510 Atherosclerotic heart disease of native coronary artery without angina pectoris: Secondary | ICD-10-CM

## 2024-08-22 DIAGNOSIS — I739 Peripheral vascular disease, unspecified: Secondary | ICD-10-CM

## 2024-08-22 DIAGNOSIS — F1290 Cannabis use, unspecified, uncomplicated: Secondary | ICD-10-CM | POA: Diagnosis not present

## 2024-08-22 DIAGNOSIS — Z79899 Other long term (current) drug therapy: Secondary | ICD-10-CM | POA: Diagnosis not present

## 2024-08-22 MED ORDER — CARVEDILOL 6.25 MG PO TABS: 6.2500 mg | ORAL_TABLET | Freq: Two times a day (BID) | ORAL | 6 refills | Status: AC

## 2024-08-22 NOTE — Patient Instructions (Addendum)
 Medication Instructions:   Increase Coreg  to 6.25mg  twice a day   Continue all other medications.     Labwork:  FLP, LFT - orders given today Please do in 3-4 weeks Reminder:  Nothing to eat or drink after 12 midnight prior to labs. Office will contact with results via phone, letter or mychart.    Testing/Procedures:  none  Follow-Up:  3 months   Any Other Special Instructions Will Be Listed Below (If Applicable).  Please keep BP log x 3-4 weeks  Nurse visit for nurse BP check - bring BP log   If you need a refill on your cardiac medications before your next appointment, please call your pharmacy.

## 2024-08-22 NOTE — Progress Notes (Unsigned)
 Cardiology Office Note:  .   Date: 08/22/2024 ID:  Steffan Samuel, DOB 1966-03-15, MRN [id number] PCP: [facility]  [facility] Providers Cardiologist:  Diannah SHAUNNA Maywood, MD    History of Present Illness: SABRA   Kacin Dancy is a 58 y.o. male with a PMH of CAD, s/p inferior STEMI (03/2023), HTN, HLD, tobacco abuse, peripheral vascular disease, GERD, hx of CVA, idioventricular rhythm, and chronic back pain, who presents today for STEMI/PCI follow-up.   Admitted 03/2023 for inferior STEMI.  Underwent cardiac catheterization that revealed total occlusion of RCA, treated with PCI/DES x 1.  Recommended for DAPT with aspirin /Brilinta  for at least 1 year.  Was seen by cardiac rehab.  Echo revealed EF 55 to 60%, hypokinesis of inferior and basal inferior lateral wall, grade 2 DD, normal RV and mild to moderate MR.  Today he presents for follow-up with his significant other.  He states he is doing well. Denies any chest pain, shortness of breath, palpitations, syncope, presyncope, dizziness, orthopnea, PND, swelling or significant weight changes, acute bleeding, or claudication. Tolerating his medications well and compliant with his medications. Doing well at cardiac rehab. SBP averages 120's - 130's at home.   Last seen by Dr. Mallipeddi on 12/17/2023. Was doing overall well at the time. Was pending complex teeth extraction at the time d/t dental abscess.   Telephone visit with Dr. Mallipeddi on 07/03/2024. Was overall doing well, but his s/o reported elevated BP's at home. Metoprolol  was switched to Coreg  and started on low dose Losartan .   08/22/2024 - Today he is here for 2 month follow-up. Compliant with medication regimen. Continues to note elevated BP readings. Denies any chest pain, shortness of breath, palpitations, syncope, presyncope, dizziness, orthopnea, PND, swelling or significant weight changes, acute bleeding, or claudication.  SH: Former smoker. Does occasionally  smoke marijuana. Drinks 1/2 can of beer weekly. Denies any other illicit drug use.    Studies Reviewed: SABRA    EKG: EKG is not ordered today.   Echo 06/2024:  1. Left ventricular ejection fraction, by estimation, is approximately  55%. The left ventricle has normal function. The left ventricle  demonstrates regional wall motion abnormalities (see scoring  diagram/findings for description). There is mild  concentric left ventricular hypertrophy. Left ventricular diastolic  parameters are indeterminate. Elevated left atrial pressure.   2. Right ventricular systolic function is normal. The right ventricular  size is normal. There is normal pulmonary artery systolic pressure. The  estimated right ventricular systolic pressure is 35.5 mmHg.   3. The mitral valve is abnormal, posterior leaflet is mildly restricted.  Moderate mitral valve regurgitation, posteriorly directed.   4. The aortic valve is tricuspid. Aortic valve regurgitation is not  visualized. No aortic stenosis is present.   5. The inferior vena cava is normal in size with greater than 50%  respiratory variability, suggesting right atrial pressure of 3 mmHg.   Comparison(s): A prior study was performed on 04/05/2023. LVEF  approximately 55%. Moderate, posterioly directed mitral regurgitation with  mildly restricted posterior leaflet.  Carotid duplex 11/2023:  Summary:  Right Carotid: The extracranial vessels were near-normal with only minimal  wall thickening or plaque.   Left Carotid: The extracranial vessels were near-normal with only minimal  wall thickening or plaque.  Echo 03/2023:  1. Left ventricular ejection fraction, by estimation, is 55%. The left  ventricle has normal function. The left ventricle demonstrates regional  wall motion abnormalities with basal inferior and basal inferolateral  severe hypokinesis.  Left ventricular  diastolic parameters are consistent with Grade II diastolic dysfunction   (pseudonormalization).   2. Right ventricular systolic function is normal. The right ventricular  size is not well visualized. There is normal pulmonary artery systolic  pressure. The estimated right ventricular systolic pressure is 21.3 mmHg.   3. The mitral valve is abnormal, there is restriction of the posterior  leaflet that may be related to recent MI with inferior and inferolateral  wall motion abnormalities. Mild to moderate mitral valve regurgitation. No  evidence of mitral stenosis.   4. The aortic valve is tricuspid. There is mild calcification of the  aortic valve. Aortic valve regurgitation is not visualized. No aortic  stenosis is present.   5. The inferior vena cava is normal in size with greater than 50%  respiratory variability, suggesting right atrial pressure of 3 mmHg.  LHC 03/2023:    Prox LAD lesion is 40% stenosed.   Mid LAD lesion is 40% stenosed.   Mid RCA lesion is 99% stenosed.   Prox RCA lesion is 40% stenosed.   1st Mrg lesion is 60% stenosed.   A drug-eluting stent was successfully placed.   Post intervention, there is a 0% residual stenosis.   Post intervention, there is a 0% residual stenosis.   The left ventricular systolic function is normal.   LV end diastolic pressure is mildly elevated.   The left ventricular ejection fraction is 50-55% by visual estimate.   Recommend uninterrupted dual antiplatelet therapy with Aspirin  81mg  daily and Ticagrelor  90mg  twice daily.   Inferior ST segment elevation myocardial infarction secondary to initial total occlusion of a large dominant RCA.   Mild concomitant CAD with 40% ostial LAD followed by 40% proximal stenosis and 20% mid stenosis.  The marginal/ramus like vessel has diffuse 60% mid stenosis.  Mild collateralization to the PDA from septal perforating arteries.   Dominant RCA with superior takeoff and probable initial total occlusion of the RCA which had opened to 99% following heparinization and visualized  on the initial injection.  There also was 40% stenosis proximal to the RV branch.   Reperfusion associated idioventricular rhythm treated with IV Lopressor .   Successful PCI to the RCA with ultimate insertion of a 3.5 x 20 and 3.5 x 12 mm Synergy stents postdilated to 3.71 mm with the stenoses being reduced to 0% and brisk TIMI-3 flow.   RECOMMENDATION: DAPT with aspirin /Brilinta  for minimum of 12 months.  Initiate beta-blocker therapy, and additional anti-ischemic medical therapy for concomitant CAD.  Aggressive lipid-lowering therapy with target LDL less than 55.  Smoking cessation is essential.   Physical Exam:   VS:  BP (!) 152/89   Pulse 93   Ht 5' 8 (1.727 m)   Wt 169 lb 3.2 oz (76.7 kg)   SpO2 98%   BMI 25.73 kg/m    Wt Readings from Last 3 Encounters:  08/22/24 169 lb 3.2 oz (76.7 kg)  07/22/24 172 lb (78 kg)  07/18/24 172 lb (78 kg)    GEN: Well nourished, well developed in no acute distress NECK: No JVD; No carotid bruits CARDIAC: S1/S2, RRR, no murmurs, rubs, gallops RESPIRATORY:  Clear to auscultation without rales, wheezing or rhonchi  ABDOMEN: Soft, non-tender, non-distended EXTREMITIES:  No edema; No deformity  ASSESSMENT AND PLAN: .    CAD, s/p inferior MI and s/p DES to RCA 03/2023 Stable with no anginal symptoms. No indication for ischemic evaluation. Continue current medication regimen. Heart healthy diet and regular cardiovascular exercise  encouraged. Discussed to avoid taking NTG and Cialis  within 48 hours of one another, verbalized understanding. Will consult attending cardiologist to see if he is okay to d/c Plavix .   2. HTN BP elevated. Discussed SBP goal < 130. Will increase Coreg  to 6.25 mg BID. Discussed to monitor BP at home at least 2 hours after medications and sitting for 5-10 minutes. No other medication changes at this time. Heart healthy diet and regular cardiovascular exercise encouraged. He will return in 3-4 weeks for a BP check.   3. HLD,  medication management Continue atorvastatin .  Due for labs and will obtain  - FLP and LFT. Heart healthy diet and regular cardiovascular exercise encouraged.   4. PVD He reported a hx of claudication during past admission. Denies any symptoms today.  Continue current medication regimen. Follow-up with [facility] as scheduled.   5. Marijuana use Drug use cessation encouraged and discussed.  Dispo: Follow-up with MD/APP in 3 months or sooner if anything changes.   Signed, Almarie Crate, NP

## 2024-08-29 ENCOUNTER — Ambulatory Visit: Admitting: Internal Medicine

## 2024-09-05 NOTE — Progress Notes (Signed)
 Patient notified

## 2024-09-05 NOTE — Addendum Note (Signed)
 Addended by: KENETH KNEE C on: 09/05/2024 12:29 PM   Modules accepted: Orders

## 2024-09-15 ENCOUNTER — Ambulatory Visit: Admitting: Urology

## 2024-09-22 ENCOUNTER — Ambulatory Visit

## 2024-09-24 ENCOUNTER — Ambulatory Visit: Attending: Cardiology | Admitting: *Deleted

## 2024-09-24 VITALS — BP 142/80 | HR 64

## 2024-09-24 DIAGNOSIS — I10 Essential (primary) hypertension: Secondary | ICD-10-CM

## 2024-09-24 NOTE — Progress Notes (Signed)
 Patient in office for nurse BP check.    BP 142/80  64   States that he took am meds around 9:00 today.   See log below - all readings are PRIOR to his morning medications -  08/29/24 - 7:00 am - 150/91  91 09/04/24 - 8:00 am - 137/89  75 09/15/2024 - 8:00 am - 142/96  78 09/20/2024 - 7:30 am - 129/86  83 09/24/2024 - 7:00 am - 128/85  74   New BP log given with specific directions to check readings 2 hours after morning medications with sitting 5-10 minutes prior.    Also, reprinted lab orders ([facility]) for them - states they will go to [facility] this week.

## 2024-09-25 ENCOUNTER — Encounter: Payer: Self-pay | Admitting: *Deleted

## 2024-09-25 NOTE — Progress Notes (Signed)
 Patient notified via mychart

## 2024-10-09 ENCOUNTER — Ambulatory Visit: Payer: Self-pay | Admitting: Nurse Practitioner

## 2024-10-13 ENCOUNTER — Encounter (HOSPITAL_COMMUNITY): Payer: Self-pay | Admitting: Urology

## 2024-11-20 ENCOUNTER — Ambulatory Visit: Admitting: Nurse Practitioner

## 2025-02-12 ENCOUNTER — Ambulatory Visit: Admitting: Urology
